# Patient Record
Sex: Male | Born: 1970
Health system: Southern US, Community
[De-identification: ages and names within clinical notes are randomized; demographics above are authoritative.]

## PROBLEM LIST (undated history)

## (undated) DIAGNOSIS — I10 Essential (primary) hypertension: Secondary | ICD-10-CM

## (undated) DIAGNOSIS — R12 Heartburn: Secondary | ICD-10-CM

## (undated) DIAGNOSIS — R319 Hematuria, unspecified: Secondary | ICD-10-CM

## (undated) DIAGNOSIS — F1011 Alcohol abuse, in remission: Secondary | ICD-10-CM

## (undated) DIAGNOSIS — F429 Obsessive-compulsive disorder, unspecified: Secondary | ICD-10-CM

## (undated) DIAGNOSIS — F419 Anxiety disorder, unspecified: Secondary | ICD-10-CM

## (undated) DIAGNOSIS — F319 Bipolar disorder, unspecified: Secondary | ICD-10-CM

## (undated) DIAGNOSIS — F952 Tourette's disorder: Secondary | ICD-10-CM

## (undated) DIAGNOSIS — K7460 Unspecified cirrhosis of liver: Secondary | ICD-10-CM

## (undated) DIAGNOSIS — E119 Type 2 diabetes mellitus without complications: Secondary | ICD-10-CM

## (undated) DIAGNOSIS — R3989 Other symptoms and signs involving the genitourinary system: Secondary | ICD-10-CM

## (undated) DIAGNOSIS — E785 Hyperlipidemia, unspecified: Secondary | ICD-10-CM

## (undated) HISTORY — DX: Obsessive-compulsive disorder, unspecified: F42.9

## (undated) HISTORY — DX: Other symptoms and signs involving the genitourinary system: R39.89

## (undated) HISTORY — DX: Hyperlipidemia, unspecified: E78.5

## (undated) HISTORY — DX: Anxiety disorder, unspecified: F41.9

## (undated) HISTORY — DX: Unspecified cirrhosis of liver: K74.60

## (undated) HISTORY — DX: Tourette's disorder: F95.2

## (undated) HISTORY — DX: Alcohol abuse, in remission: F10.11

## (undated) HISTORY — DX: Hematuria, unspecified: R31.9

## (undated) HISTORY — DX: Bipolar disorder, unspecified: F31.9

---

## 2008-08-19 ENCOUNTER — Emergency Department (HOSPITAL_COMMUNITY): Admission: EM | Admit: 2008-08-19 | Discharge: 2008-08-19 | Payer: Self-pay | Admitting: Emergency Medicine

## 2009-05-05 HISTORY — PX: MULTIPLE TOOTH EXTRACTIONS: SHX2053

## 2009-12-24 ENCOUNTER — Ambulatory Visit: Payer: Self-pay | Admitting: Internal Medicine

## 2009-12-24 DIAGNOSIS — Z9189 Other specified personal risk factors, not elsewhere classified: Secondary | ICD-10-CM | POA: Insufficient documentation

## 2009-12-24 DIAGNOSIS — T887XXA Unspecified adverse effect of drug or medicament, initial encounter: Secondary | ICD-10-CM | POA: Insufficient documentation

## 2009-12-24 DIAGNOSIS — E785 Hyperlipidemia, unspecified: Secondary | ICD-10-CM | POA: Insufficient documentation

## 2009-12-24 DIAGNOSIS — F952 Tourette's disorder: Secondary | ICD-10-CM | POA: Insufficient documentation

## 2009-12-24 DIAGNOSIS — K219 Gastro-esophageal reflux disease without esophagitis: Secondary | ICD-10-CM | POA: Insufficient documentation

## 2009-12-24 LAB — CONVERTED CEMR LAB
Cholesterol, target level: 200 mg/dL
LDL Goal: 160 mg/dL

## 2010-02-15 ENCOUNTER — Encounter: Payer: Self-pay | Admitting: Internal Medicine

## 2010-06-04 NOTE — Assessment & Plan Note (Signed)
Summary: NEW TO EST/KN   Vital Signs:  Patient profile:   40 year old male Height:      71 inches Weight:      191.4 pounds BMI:     26.79 Temp:     98.7 degrees F oral Pulse rate:   84 / minute Resp:     14 per minute BP sitting:   136 / 80  (left arm) Cuff size:   large  Vitals Entered By: Shonna Chock CMA (December 24, 2009 1:57 PM) CC: New Patient Establish: Referral to specialist, Lipid Management   CC:  New Patient Establish: Referral to specialist and Lipid Management.  History of Present Illness: Miguel Guerra is here to establish ; he has Tourette's Syndrome & he feels the medication, Orap ( Pimozide) is  not controlling his condition adequately & requests referral. He has been  on 1 mg once daily for 8 -10 years ; Epocrates states 1-2  mg is starting dose & maximal dose = 10 mg / day. Complete labs & EKG done in 05/2009 @ [facility].  Lipid Management History:      Positive NCEP/ATP III risk factors include current tobacco user.  Negative NCEP/ATP III risk factors include male age less than 98 years old, non-diabetic, no family history for ischemic heart disease, non-hypertensive, no ASHD (atherosclerotic heart disease), no prior stroke/TIA, no peripheral vascular disease, and no history of aortic aneurysm.     Preventive Screening-Counseling & Management  Alcohol-Tobacco     Smoking Status: current  Caffeine-Diet-Exercise     Does Patient Exercise: yes  Allergies (verified): 1)  ! Pcn  Past History:  Past Medical History: Tourette's Syndrome Hyperlipidemia: Framingham's Study LDL goal = < 160. GERD  Past Surgical History: TEETH EXTRACTION, PMH  OF (ICD-V15.9)    Family History: Father: Living, Alcohol excess Mother: Living, PMH  of Beast Cancer Siblings: 3 sisters: negative; MGF: MI after 43; P uncle: DM  Social History: Occupation: Administrator, [employer] Current Smoker: 1 ppd Alcohol use-yes: 6-8 beers / day Regular exercise-yes: walking 1 mpd >  3X/week Smoking Status:  current Does Patient Exercise:  yes  Review of Systems  The patient denies anorexia, fever, vision loss, decreased hearing, hoarseness, chest pain, syncope, dyspnea on exertion, peripheral edema, abdominal pain, melena, hematochezia, severe indigestion/heartburn, hematuria, suspicious skin lesions, abnormal bleeding, enlarged lymph nodes, and angioedema.         Weight gain of 50 # over 12 months, ? from beer intake. No dysphagia. Resp:  Complains of cough; denies coughing up blood, shortness of breath, sputum productive, and wheezing. Neuro:  Denies brief paralysis, difficulty with concentration, disturbances in coordination, headaches, memory loss, numbness, poor balance, sensation of room spinning, and tremors; Visual loss  for 15-20 minutes with painless migraines w/o prodrome. Occasional tingling in hands. Psych:  Denies anxiety, depression, easily angered, easily tearful, and irritability; PMH of depression.  Physical Exam  General:  well-nourished; alert,appropriate and cooperative throughout examination Head:  Normocephalic and atraumatic without obvious abnormalities. Pattern  alopecia  Eyes:  No corneal or conjunctival inflammation noted. EOMI. Perrla. Funduscopic exam benign, without hemorrhages, exudates or papilledema.  Ears:  External ear exam shows no significant lesions or deformities.  Otoscopic examination reveals clear canals, tympanic membranes are intact bilaterally without bulging, retraction, inflammation or discharge. Hearing is grossly normal bilaterally. Nose:  External nasal examination shows no deformity or inflammation. Nasal mucosa are pink and moist without lesions or exudates. Mouth:  Oral mucosa and oropharynx  without lesions or exudates.  Teeth in good repair but tooth loss. Neck:  No deformities, masses, or tenderness noted. Lungs:  Normal respiratory effort, chest expands symmetrically. Lungs are clear to auscultation, no crackles or  wheezes. Heart:  Normal rate and regular rhythm. S1 and S2 normal without gallop, murmur, click, rub .S4 Abdomen:  Bowel sounds positive,abdomen soft and non-tender without masses, organomegaly or hernias noted. Msk:  No deformity or scoliosis noted of thoracic or lumbar spine.   Pulses:  R and L carotid,radial,dorsalis pedis and posterior tibial pulses are full and equal bilaterally Extremities:  No clubbing, cyanosis, edema, or deformity noted with normal full range of motion of all joints.   Neurologic:  alert & oriented X3, cranial nerves II-XII intact, strength normal in all extremities, sensation intact to light touch, gait normal, DTRs symmetrical and normal, finger-to-nose normal, and Romberg negative.   Skin:  Intact without suspicious lesions or rashes Cervical Nodes:  No lymphadenopathy noted Axillary Nodes:  No palpable lymphadenopathy Psych:  memory intact for recent and remote, normally interactive, and good eye contact.     Impression & Recommendations:  Problem # 1:  TOURETTE'S DISORDER (ICD-307.23)  Suboptimal control  Orders: TLB-CBC Platelet - w/Differential (85025-CBCD) TLB-Hepatic/Liver Function Pnl (80076-HEPATIC) TLB-Creatinine, Blood (82565-CREA) TLB-Potassium (K+) (84132-K) TLB-BUN (Urea Nitrogen) (84520-BUN) Neurology Referral (Neuro)  Problem # 2:  HYPERLIPIDEMIA (ICD-272.4) Labs done 05/2009  Problem # 3:  GERD (ICD-530.81)  His updated medication list for this problem includes:    Ranitidine Hcl 150 Mg Tabs (Ranitidine hcl) .Marland Kitchen... 1 two times a day pre meals  Complete Medication List: 1)  Crestor-? Dose  .Marland KitchenMarland Kitchen. 1 by mouth once daily 2)  Orap 1 Mg Tabs (Pimozide) .Marland Kitchen.. 1 by mouth  two times a day pending neurology appt 3)  Otc Anti-acid  .Marland Kitchen.. 1 by mouth once daily 4)  Ranitidine Hcl 150 Mg Tabs (Ranitidine hcl) .Marland Kitchen.. 1 two times a day pre meals  Lipid Assessment/Plan:      Based on NCEP/ATP III, the patient's risk factor category is "0-1 risk factors".   The patient's lipid goals are as follows: Total cholesterol goal is 200; LDL cholesterol goal is 160; HDL cholesterol goal is 40; Triglyceride goal is 150.    Patient Instructions: 1)  Avoid foods high in acid (tomatoes, citrus juices, spicy foods). Avoid eating within two hours of lying down or before exercising. Do not over eat; try smaller more frequent meals. Elevate head of bed twelve inches when sleeping.Consider the Smoking Cessation program @ [facility] [phone]). Please FAX ( [phone])  Lab results from 05/2009. Prescriptions: RANITIDINE HCL 150 MG TABS (RANITIDINE HCL) 1 two times a day pre meals  #60 x 5   Entered and Authorized by:   Marga Melnick MD   Signed by:   Marga Melnick MD on 12/24/2009   Method used:   Print then Give to Patient   RxID:   [id number]

## 2010-06-04 NOTE — Consult Note (Signed)
Summary: [facility]  [facility]   Imported By: Lanelle Bal 02/23/2010 08:26:27  _____________________________________________________________________  External Attachment:    Type:   Image     Comment:   External Document

## 2010-08-14 LAB — URINALYSIS, ROUTINE W REFLEX MICROSCOPIC
Specific Gravity, Urine: 1.034 — ABNORMAL HIGH (ref 1.005–1.030)
Urobilinogen, UA: 2 mg/dL — ABNORMAL HIGH (ref 0.0–1.0)

## 2010-08-14 LAB — URINE MICROSCOPIC-ADD ON

## 2011-01-06 IMAGING — CT CT NECK W/ CM
3 of 4 series · 16 of 33 positions shown, 19 images · IV contrast (APPLIED)
Comparison: None

CLINICAL DATA: Tooth pain.  Evaluate for lingual abscess

CT NECK WITH CONTRAST
TECHNIQUE: Multidetector CT imaging of the neck was performed with
intravenous contrast.
Contrast: 100 ml Dmnipaque-JCC

[Series 5: neck_routine 3.0 b40s st · axial · 0.36mm/px · z∈[+995,+1160]mm · 8 of 69 slices shown, 10 images]
[im 7/69  soft-tissue]
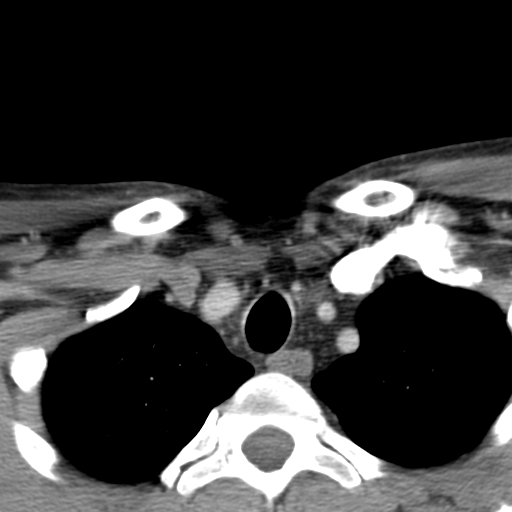
[im 7/69  bone]
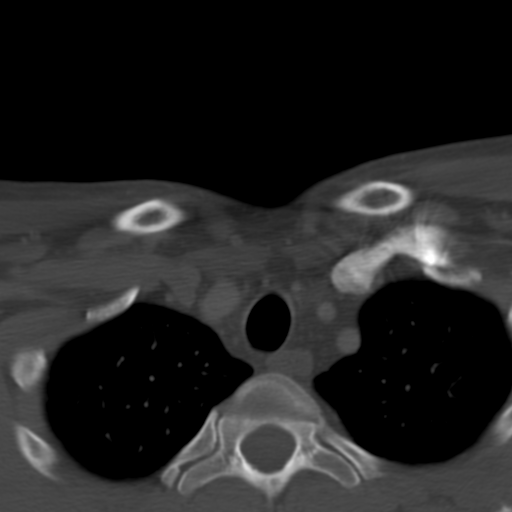
[im 14/69  bone]
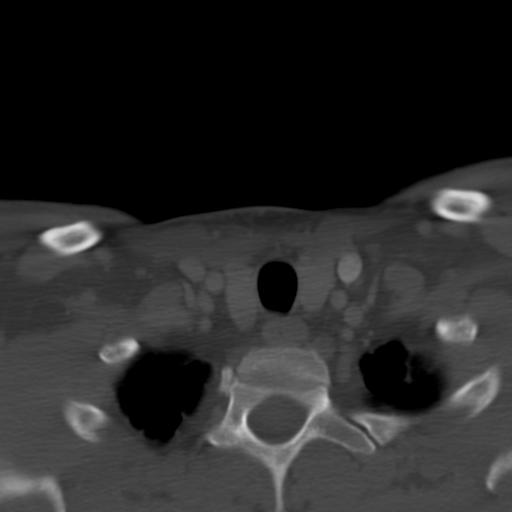
[im 21/69  bone]
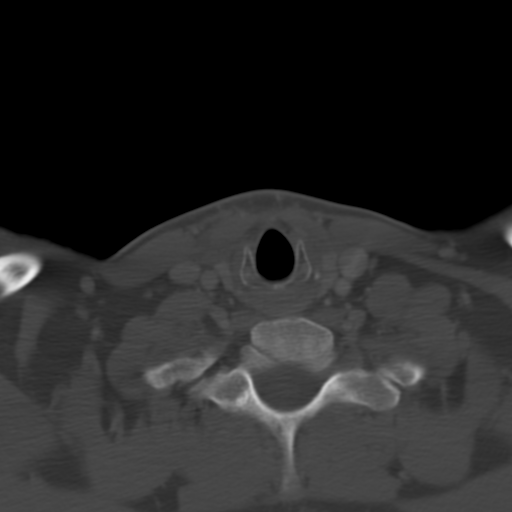
[im 28/69  bone]
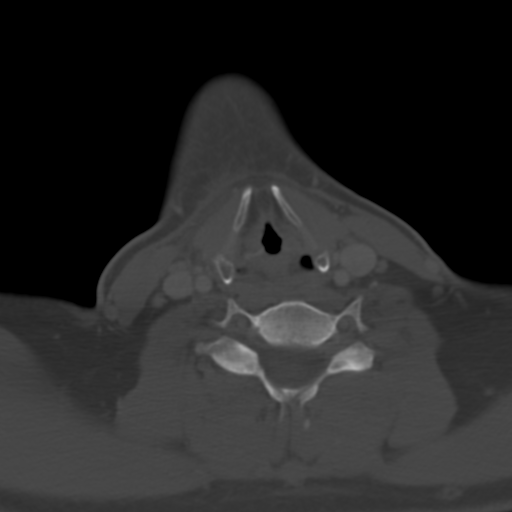
[im 41/69  soft-tissue]
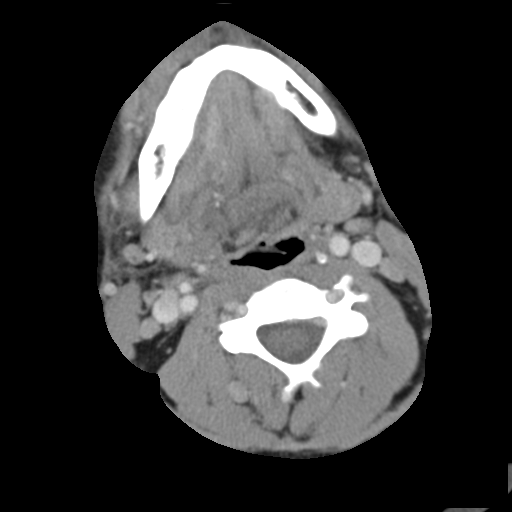
[im 41/69  bone]
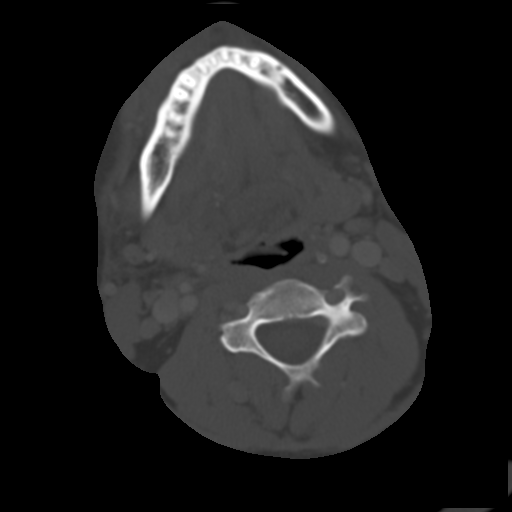
[im 48/69  bone]
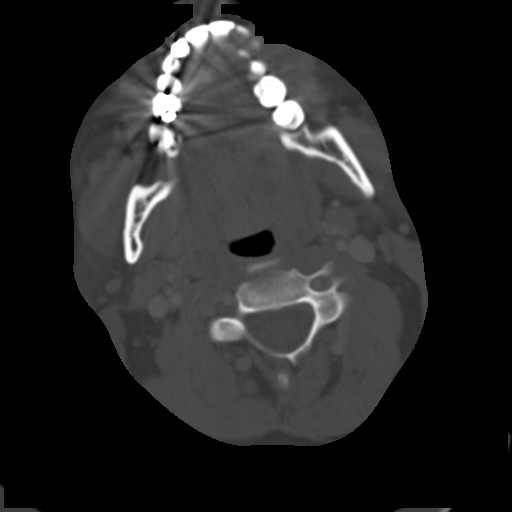
[im 55/69  bone]
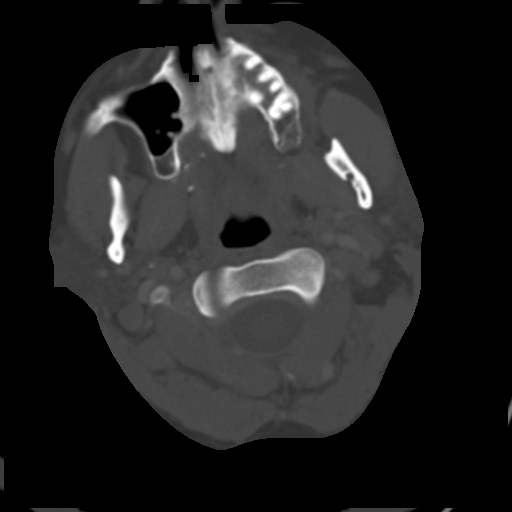
[im 62/69  bone]
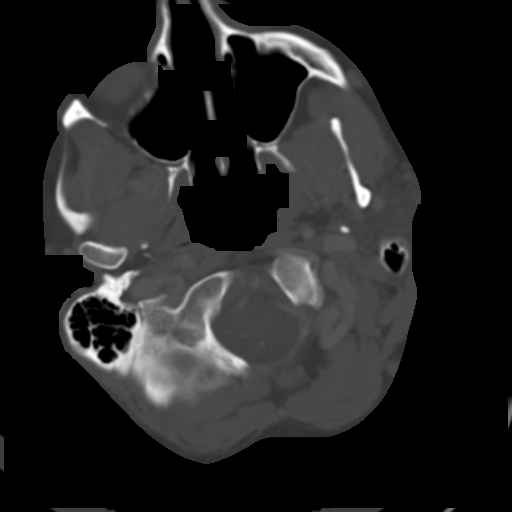

[Series 602: coronal · coronal · 0.41mm/px · 3 of 78 slices shown]
[im 16/78  bone]
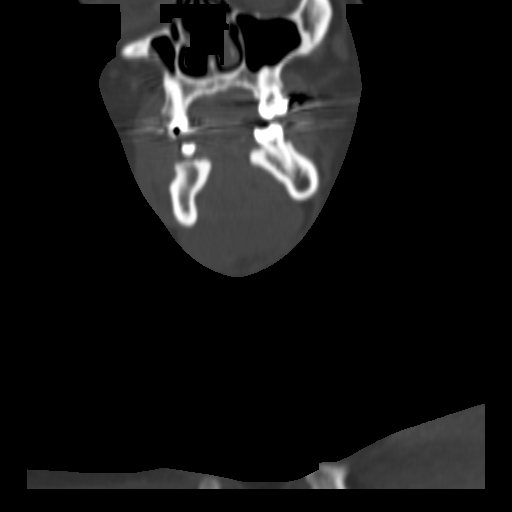
[im 31/78  bone]
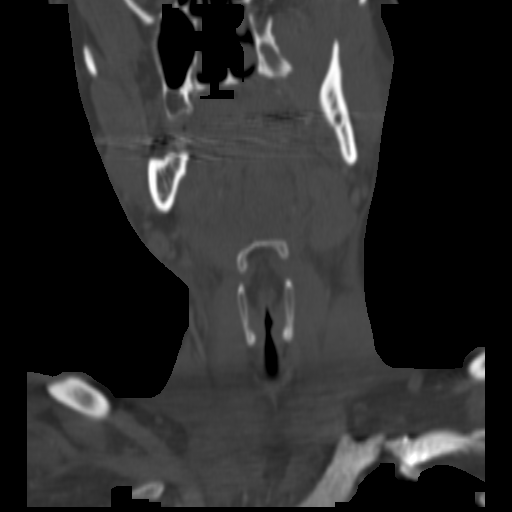
[im 47/78  bone]
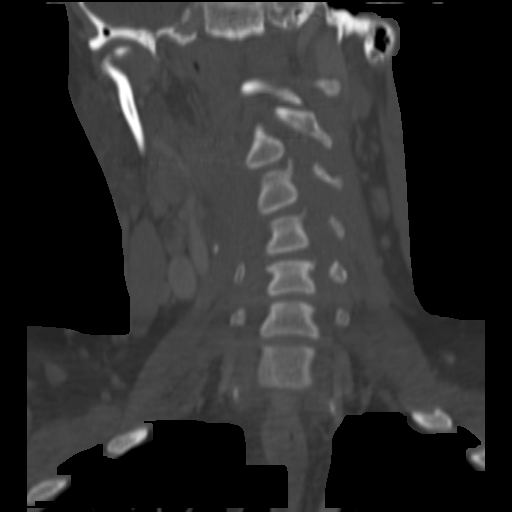

[Series 603: sagittal · sagittal · 0.41mm/px · 5 of 47 slices shown, 6 images]
[im 16/47  bone]
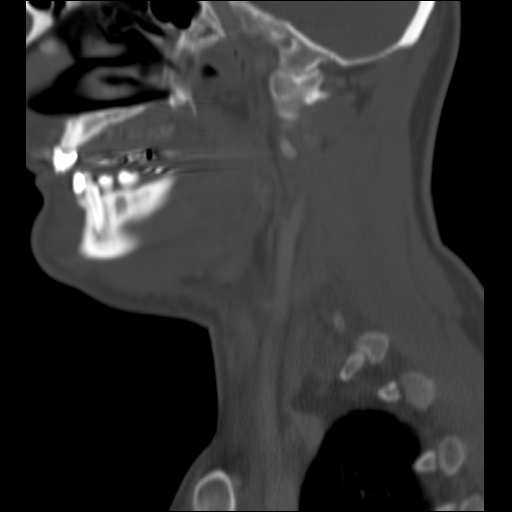
[im 20/47  bone]
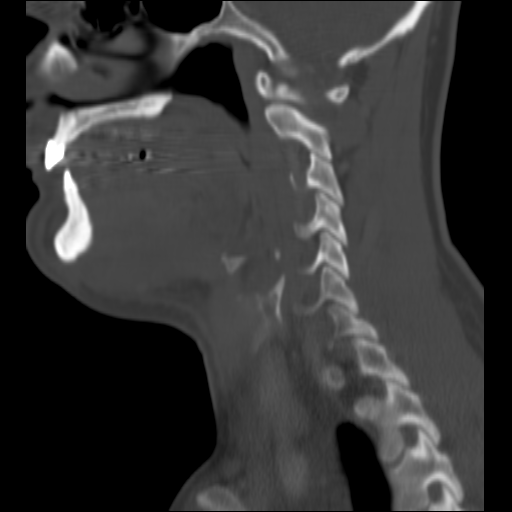
[im 24/47  soft-tissue]
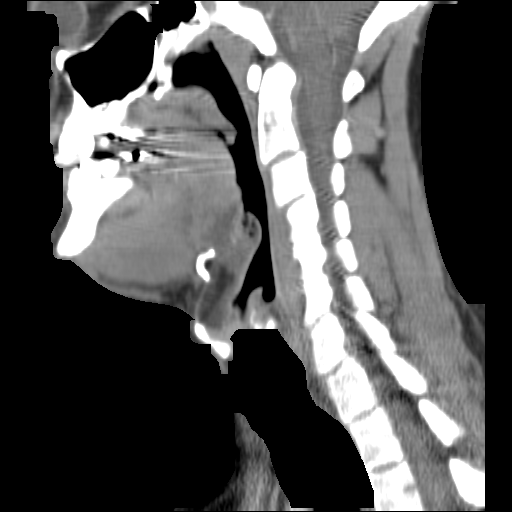
[im 24/47  bone]
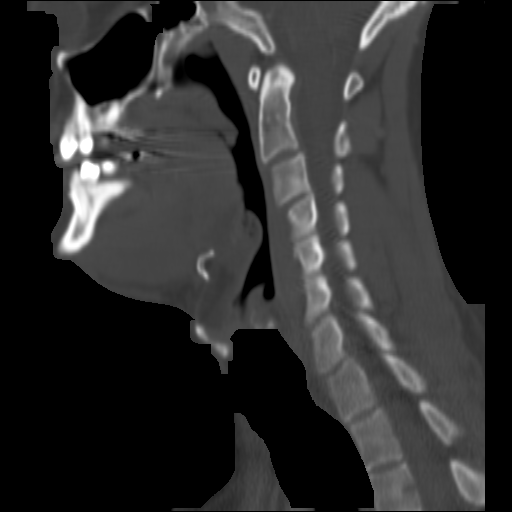
[im 27/47  bone]
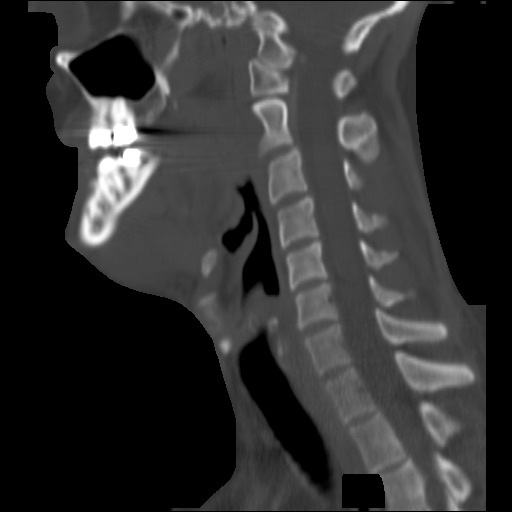
[im 31/47  bone]
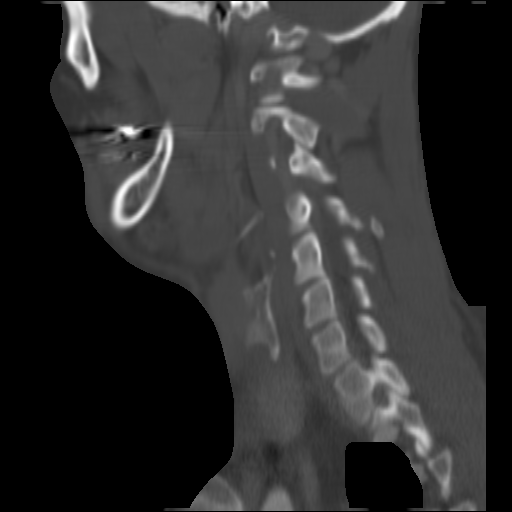

[16 of 33 positions shown; findings below may reference images not displayed]

FINDINGS: There is thickening involving the musculature in the
floor of the mouth, and this is best seen on the sagittal views.
There is a small 8 x 17 mm fluid collection within the anterior
aspect of the inflammatory process on image 36. Small inflammatory
nodes are present in the cervical chain.  There is peri apical
lucency surrounding the right lateral lower incisor.  This is
worrisome for periodontal abscess.  No evidence of peritonsillar
abscess.  The tongue base is within normal limits.  Nasopharynx and
oropharynx are patent.  Thyroid gland is within normal limits.
IMPRESSION: Inflammatory process involving the musculature of the floor of the
mouth.  In the anterior aspect, there is a small fluid collection
worrisome for a small abscess.  This is associated with periodontal
abscess involving the right lateral lower incisor.

## 2012-12-16 ENCOUNTER — Emergency Department (INDEPENDENT_AMBULATORY_CARE_PROVIDER_SITE_OTHER)
Admission: EM | Admit: 2012-12-16 | Discharge: 2012-12-16 | Disposition: A | Discharge status: Home / Self Care | Payer: Self-pay | Source: Home / Self Care

## 2012-12-16 ENCOUNTER — Encounter (HOSPITAL_COMMUNITY): Payer: Self-pay | Admitting: *Deleted

## 2012-12-16 DIAGNOSIS — L03119 Cellulitis of unspecified part of limb: Secondary | ICD-10-CM

## 2012-12-16 DIAGNOSIS — L02419 Cutaneous abscess of limb, unspecified: Secondary | ICD-10-CM

## 2012-12-16 HISTORY — DX: Type 2 diabetes mellitus without complications: E11.9

## 2012-12-16 HISTORY — DX: Heartburn: R12

## 2012-12-16 HISTORY — DX: Essential (primary) hypertension: I10

## 2012-12-16 MED ORDER — DOXYCYCLINE HYCLATE 100 MG PO CAPS: 100.0000 mg | ORAL_CAPSULE | Freq: Two times a day (BID) | ORAL | Status: DC

## 2012-12-16 NOTE — ED Provider Notes (Signed)
Medical screening examination/treatment/procedure(s) were performed by non-physician practitioner and as supervising physician I was immediately available for consultation/collaboration.  Leslee Home, M.D.  Reuben Likes, MD 12/16/12 1434

## 2012-12-16 NOTE — ED Notes (Signed)
Boil on R post. Thigh and R groin onset 3-4 days ago.  Have not drained.  Very sensitive.  Had one in Jan. But states [facility] did not do a culture  but did take Doxycycline.

## 2012-12-16 NOTE — ED Provider Notes (Signed)
CSN: [id number]     Arrival date & time 12/16/12  0902 History     First MD Initiated Contact with Patient 12/16/12 1030     Chief Complaint  Patient presents with  . Abscess   (Consider location/radiation/quality/duration/timing/severity/associated sxs/prior Treatment) HPI Comments: 42 year old male with a history of type 2 diabetes mellitus and skin infections with abscess formation most likely due to staphylococcus or MRSA. Today he presents with 2 lesions, one located to the right anterior proximal thigh and the second located in the right proximal posterior medial thigh. Each of these approximately the same size with a 2 and half to 3 cm diameter. There is underlying induration but is not appear or palpates as fluctuant.   Past Medical History  Diagnosis Date  . Diabetes mellitus without complication     off medication x 1 month  . Heartburn   . Hypertension    Past Surgical History  Procedure Laterality Date  . Multiple tooth extractions  2011   Family History  Problem Relation Age of Onset  . Breast cancer Mother    History  Substance Use Topics  . Smoking status: Current Some Day Smoker    Types: Cigars  . Smokeless tobacco: Not on file  . Alcohol Use: 43.2 oz/week    70 Cans of beer, 2 Shots of liquor per week    Review of Systems  Constitutional: Negative.   Respiratory: Negative.   Skin: Negative for rash.       Per history of present illness    Allergies  Penicillins  Home Medications   Current Outpatient Rx  Name  Route  Sig  Dispense  Refill  . Omeprazole-Sodium Bicarbonate (ZEGERID OTC PO)   Oral   Take by mouth.         . pimozide (ORAP) 2 MG tablet   Oral   Take 2 mg by mouth once.         . terbinafine (LAMISIL) 250 MG tablet   Oral   Take 250 mg by mouth daily.         Marland Kitchen doxycycline (VIBRAMYCIN) 100 MG capsule   Oral   Take 1 capsule (100 mg total) by mouth 2 (two) times daily.   20 capsule   0    BP 145/88  Pulse 83   Temp(Src) 98.2 F (36.8 C) (Oral)  Resp 18  SpO2 100% Physical Exam  Nursing note and vitals reviewed. Constitutional: He is oriented to person, place, and time. He appears well-developed and well-nourished. No distress.  Neck: Normal range of motion. Neck supple.  Pulmonary/Chest: Effort normal. No respiratory distress.  Neurological: He is alert and oriented to person, place, and time. He exhibits normal muscle tone.  Skin: Skin is warm and dry. No rash noted. He is not diaphoretic.  See history of present illness for description of the 2 right upper extremity lesions. There is no associated lymphangitis or radiating erythema. No evidence of cellulitis.  Psychiatric: He has a normal mood and affect.    ED Course   Procedures (including critical care time)  Labs Reviewed - No data to display No results found. 1. Bacterial skin infection of leg     MDM  These are early abscess  which do not appear fluctuant at this time. We will treat with doxycycline 100 mg twice a day for 10 days, same treatment as he had in January with similar lesions and a good outcome. Very important to apply warm compresses several times  during the day. If there is any worsening, redness, lymphangitis, increasing in size and becoming more painful when need to return.  Hayden Rasmussen, NP 12/16/12 1050

## 2013-06-17 ENCOUNTER — Encounter: Payer: Self-pay | Admitting: Neurology

## 2013-06-20 ENCOUNTER — Telehealth: Payer: Self-pay | Admitting: Neurology

## 2013-06-21 ENCOUNTER — Ambulatory Visit: Payer: BC Managed Care – PPO | Admitting: Neurology

## 2013-10-17 ENCOUNTER — Ambulatory Visit (INDEPENDENT_AMBULATORY_CARE_PROVIDER_SITE_OTHER): Payer: BC Managed Care – PPO | Admitting: Neurology

## 2013-10-17 ENCOUNTER — Encounter: Payer: Self-pay | Admitting: Neurology

## 2013-10-17 VITALS — BP 116/74 | HR 65 | Ht 70.5 in | Wt 180.0 lb

## 2013-10-17 DIAGNOSIS — F952 Tourette's disorder: Secondary | ICD-10-CM

## 2013-10-17 MED ORDER — PIMOZIDE 2 MG PO TABS: 2.0000 mg | ORAL_TABLET | Freq: Two times a day (BID) | ORAL | Status: DC

## 2013-10-17 NOTE — Progress Notes (Signed)
Reason for visit: Tourette's syndrome  Miguel Guerra is a 43 y.o. male  History of present illness:  Miguel Guerra is a 43 year old right-handed white male with a history of Tourette syndrome that has also been associated with obsessive compulsive tendencies and anxiety and depression. The patient lost his job approximately 2 years ago associated with alcoholism. Since that time, he has been able to get off of alcohol, but as time has gone, his Tourette's type symptoms have worsened. The patient has been on Orap for 5 or 6 years with good benefit. The patient has motor tics involving the head with head shaking intermittently. This has started to return some. The patient takes Xanax for anxiety issues, and he is on Depakote. The patient has diabetes, but he has had good control of the blood sugars with weight loss. He indicates that his mother also has obsessive compulsive tendencies. He has not been able to maintain gainful employment over the last 2 years. He returns to this office for an evaluation. The patient is on Abilify secondary to some problems with hallucinations that occurred in the past.  Past Medical History  Diagnosis Date  . Diabetes mellitus without complication     off medication x 1 month  . Heartburn   . Hypertension   . Hematuria   . Other symptoms involving urinary system(788.99)   . History of alcohol abuse   . Tourette disorder   . Anxiety   . OCD (obsessive compulsive disorder)     Past Surgical History  Procedure Laterality Date  . Multiple tooth extractions  2011    Family History  Problem Relation Age of Onset  . Breast cancer Mother   . OCD Mother   . Skin cancer Father     Social history:  reports that he has been smoking Cigars.  He has never used smokeless tobacco. He reports that he does not drink alcohol or use illicit drugs.  Medications:  Current Outpatient Prescriptions on File Prior to Visit  Medication Sig Dispense Refill  . ALPRAZolam  (XANAX) 0.5 MG tablet Take 0.5 mg by mouth 3 (three) times daily.      Marland Kitchen atorvastatin (LIPITOR) 40 MG tablet Take 40 mg by mouth daily.      Marland Kitchen lisinopril (PRINIVIL,ZESTRIL) 10 MG tablet Take 10 mg by mouth daily.      . metFORMIN (GLUCOPHAGE) 1000 MG tablet Take 1,000 mg by mouth 2 (two) times daily with a meal.      . Omeprazole-Sodium Bicarbonate (ZEGERID OTC PO) Take by mouth.       No current facility-administered medications on file prior to visit.      Allergies  Allergen Reactions  . Penicillins     REACTION: Swelling of Neck    ROS:  Out of a complete 14 system review of symptoms, the patient complains only of the following symptoms, and all other reviewed systems are negative.  Diarrhea Depression, anxiety, decreased energy, disinterest in activities, hallucinations, racing thoughts  Blood pressure 116/74, pulse 65, height 5' 10.5" (1.791 m), weight 180 lb (81.647 kg).  Physical Exam  General: The patient is alert and cooperative at the time of the examination.  Eyes: Pupils are equal, round, and reactive to light. Discs are flat bilaterally.  Neck: The neck is supple, no carotid bruits are noted.  Respiratory: The respiratory examination is clear.  Cardiovascular: The cardiovascular examination reveals a regular rate and rhythm, no obvious murmurs or rubs are noted.  Skin:  Extremities are without significant edema.  Neurologic Exam  Mental status: The patient is alert and oriented x 3 at the time of the examination. The patient has apparent normal recent and remote memory, with an apparently normal attention span and concentration ability.  Cranial nerves: Facial symmetry is present. There is good sensation of the face to pinprick and soft touch bilaterally. The strength of the facial muscles and the muscles to head turning and shoulder shrug are normal bilaterally. Speech is well enunciated, no aphasia or dysarthria is noted. Extraocular movements are full.  Visual fields are full. The tongue is midline, and the patient has symmetric elevation of the soft palate. No obvious hearing deficits are noted.  Motor: The motor testing reveals 5 over 5 strength of all 4 extremities. Good symmetric motor tone is noted throughout.  Sensory: Sensory testing is intact to pinprick, soft touch, vibration sensation, and position sense on all 4 extremities. No evidence of extinction is noted.  Coordination: Cerebellar testing reveals good finger-nose-finger and heel-to-shin bilaterally.  Gait and station: Gait is normal. Tandem gait is normal. Romberg is negative. No drift is seen.  Reflexes: Deep tendon reflexes are symmetric and normal bilaterally. Toes are downgoing bilaterally.   Assessment/Plan:  1. Tourette's syndrome  2. Anxiety, depression  3. History of alcohol abuse  The patient will be increased on the Orap taking 2 mg twice daily. The patient will followup through this office in about 6 months. If he has side effects on the medication, he is to contact me. No issues with motor tics were observed during this examination.  Jill Alexanders MD 10/17/2013 7:49 PM  [facility] [address] [place], [place] [address]  Phone [phone] Fax [phone]

## 2013-10-17 NOTE — Patient Instructions (Signed)
Tourette Syndrome Tourette syndrome (TS) is an inherited neurological disorder. It shows up in childhood usually before age 45 years. Symptoms reveal themselves as repeated involuntary movements and uncontrollable vocal sounds. Both the sounds and the movements are called tics. Less commonly the tics may include inappropriate words and phrases. Usually this syndrome is mild and often may not even be diagnosed or suspected. Uncommonly the problem is severe.  CAUSES  The exact cause of TS is unknown. Genetic and environmental factors may be involved. The majority of cases of TS are inherited. No gene has been identified which causes TS. In some cases, tics may not be inherited. These cases are identified as sporadic TS. SYMPTOMS  The first symptoms of TS are usually facial tics and eye blinking. Tics may begin in one part of the body and spread. Some patients will have one tic, while other patients will have several tics. Tics may also increase in number over time. Children will often be distracted by the tics and may appear to have trouble concentrating. Other motor tics may appear, such as:  Head jerking.  Neck stretching.  Jumping  Foot stamping.  Body twisting and bending. Vocal tics are also common in a person with TS and include:  Shout.  Bark.  Yelp.  Grunt.  Sniff.  Cough.  Clear his or her throat. A person with TS may touch other people excessively or repeat actions over and over. Some TS patients also express compulsive behaviors like checking, counting or repeating certain words. A few patients with TS have self-harming behaviors such as lip and cheek biting and head banging. People with TS can sometimes suppress their tics for a short time, but eventually tension mounts to the point where the tic must escape. Tics worsen in stressful situations and improve when the person relaxes or is absorbed in an activity.  Additionally, there is an association with attention deficit  hyperactivity disorder (50% patients with TS also have ADHD), learning disabilities (30%), and obsessive compulsive disorder (25%- 40%). DIAGNOSIS  Tourette syndrome is diagnosed by observing the symptoms and evaluating family history. In order for a child to be diagnosed with TS, they must have both a motor and verbal tic for at least 12 months. TS is a diagnosis made from a study of the signs and symptoms of a disease (clinical diagnosis). There are no medical or screening tests that can help in making the diagnosis, but your physician may order other tests (EEG, MRI, CAT scan, or blood tests) to make sure your child's symptoms are not due to another condition. TREATMENT   Medication management is available for TS, and its associated conditions. Not all tics need medication and no medication will completely make tics go away. Treatment decisions need to consider both the potential side effects of medication and the quality of life of the patient.  Medication is also available to help when symptoms are troubling or interfere with life. TS medications are only able to help reduce specific symptoms. Relaxation techniques and biofeedback may be useful in handling and lessening stress. Behavioral therapies include:  Skill building- focuses on deficiencies in academic and social skills.  Behavioral excesses- focuses on the elimination of unwanted behaviors.  Educational accommodations can include un-timed tests or the use of scribes for those with handwriting problems. There is no cure for Tourette's and no medication that works for all people. Knowledge, education and understanding are uppermost in management plans for tic disorders. Supportive counseling and therapy may help:  Avoid depression.  Limit social isolation.  Improve family support. Educating the patient, family, and patient's community (friends, school, and church) are key treatment strategies. This may be all that is required in mild  cases. There is no cure for TS. The condition in many individuals improves as they mature. Although TS is generally lifelong and chronic, it generally does not get worse as children get older. In a few cases, complete remission occurs after adolescence. Document Released: 04/21/2005 Document Revised: 07/14/2011 Document Reviewed: 02/19/2009 [facility] Patient Information 2014 [place].

## 2013-11-01 NOTE — Telephone Encounter (Signed)
Closing encounter

## 2014-01-06 ENCOUNTER — Other Ambulatory Visit: Payer: Self-pay | Admitting: Family Medicine

## 2014-01-06 ENCOUNTER — Ambulatory Visit
Admission: RE | Admit: 2014-01-06 | Discharge: 2014-01-06 | Disposition: A | Discharge status: Home / Self Care | Payer: BC Managed Care – PPO | Source: Ambulatory Visit | Attending: Family Medicine | Admitting: Family Medicine

## 2014-01-06 DIAGNOSIS — R062 Wheezing: Secondary | ICD-10-CM

## 2014-01-11 ENCOUNTER — Emergency Department (HOSPITAL_COMMUNITY)
Admission: EM | Admit: 2014-01-11 | Discharge: 2014-01-11 | Discharge status: Against Medical Advice | Payer: BC Managed Care – PPO | Source: Home / Self Care | Attending: Family Medicine | Admitting: Family Medicine

## 2014-01-11 ENCOUNTER — Encounter (HOSPITAL_COMMUNITY): Payer: Self-pay | Admitting: Emergency Medicine

## 2014-01-11 NOTE — ED Notes (Signed)
C/o rash to right lower extremity onset 1 week Believes it maybe due to a medication reaction  Taking PCN for a URI??? Rash is getting better Alert, no signs of acute distress.

## 2014-01-11 NOTE — ED Notes (Signed)
Pt not found; Left AMA... Notified. Dr. Marily Memos.

## 2014-01-11 NOTE — ED Notes (Signed)
Provider went into room and pt was nowhere to be found; will check in a few minutes again

## 2014-02-21 ENCOUNTER — Telehealth: Payer: Self-pay | Admitting: Neurology

## 2014-02-21 NOTE — Telephone Encounter (Signed)
Spoke to patient and he will come tomorrow 02-22-14 for appointment with doctor.  He relayed he will speak to him at that time.

## 2014-02-21 NOTE — Telephone Encounter (Signed)
Patient calling to request sooner appointment with Dr. Jannifer Franklin per his PCP's advice, states that he's been very dizzy and off balance as well as forgetful, please return call and advise.

## 2014-02-22 ENCOUNTER — Encounter: Payer: Self-pay | Admitting: Neurology

## 2014-02-22 ENCOUNTER — Ambulatory Visit (INDEPENDENT_AMBULATORY_CARE_PROVIDER_SITE_OTHER): Payer: BC Managed Care – PPO | Admitting: Neurology

## 2014-02-22 ENCOUNTER — Encounter (INDEPENDENT_AMBULATORY_CARE_PROVIDER_SITE_OTHER): Payer: Self-pay

## 2014-02-22 VITALS — BP 125/86 | HR 98 | Ht 71.0 in | Wt 197.8 lb

## 2014-02-22 DIAGNOSIS — Z5181 Encounter for therapeutic drug level monitoring: Secondary | ICD-10-CM

## 2014-02-22 DIAGNOSIS — F952 Tourette's disorder: Secondary | ICD-10-CM

## 2014-02-22 NOTE — Progress Notes (Signed)
Reason for visit: Tourette's syndrome  Miguel Guerra is an 43 y.o. male  History of present illness:  Miguel Guerra is a 43 year old right-handed white male with a history of Tourette syndrome. The patient is also followed by Dr. Candis Schatz from psychiatry. The patient began having hallucinations sometime within the last 6 months. The patient has been on pimozide 2 mg twice daily. The patient had been doing fairly well with his Tourette's syndrome on this medication. The patient has had several new medications added within the last several months. Three or four months ago, the patient was placed on Depakote, currently on 500 mg 3 times daily. The patient was also apparently placed on lamotrigine within the last 2 months, currently on 200 mg daily, and Risperdal was also added, currently on 4 mg daily. The patient has begun having issues with dizziness, decreased ability to concentrate. The patient comes to the office today for an evaluation of his current issues. The patient was able to drive to our office today, but he does not feel certain about his abilities to drive.  Past Medical History  Diagnosis Date  . Diabetes mellitus without complication     off medication x 1 month  . Heartburn   . Hypertension   . Hematuria   . Other symptoms involving urinary system(788.99)   . History of alcohol abuse   . Tourette disorder   . Anxiety   . OCD (obsessive compulsive disorder)     Past Surgical History  Procedure Laterality Date  . Multiple tooth extractions  2011    Family History  Problem Relation Age of Onset  . Breast cancer Mother   . OCD Mother   . Skin cancer Father     Social history:  reports that he has been smoking Cigars.  He has never used smokeless tobacco. He reports that he does not drink alcohol or use illicit drugs.    Allergies  Allergen Reactions  . Penicillins     REACTION: Swelling of Neck    Medications:  Current Outpatient Prescriptions on File Prior  to Visit  Medication Sig Dispense Refill  . ALPRAZolam (XANAX) 0.5 MG tablet Take 0.5 mg by mouth 3 (three) times daily.      Marland Kitchen atorvastatin (LIPITOR) 40 MG tablet Take 40 mg by mouth daily.      . divalproex (DEPAKOTE) 500 MG DR tablet Take 500 mg by mouth 3 (three) times daily.       Marland Kitchen lisinopril (PRINIVIL,ZESTRIL) 10 MG tablet Take 10 mg by mouth daily.      . pimozide (ORAP) 2 MG tablet Take 1 tablet (2 mg total) by mouth 2 (two) times daily.  60 tablet  5   No current facility-administered medications on file prior to visit.    ROS:  Out of a complete 14 system review of symptoms, the patient complains only of the following symptoms, and all other reviewed systems are negative.   Weight change Double vision, blurred vision Chest tightness Achy muscles, walking difficulties, neck stiffness Memory loss, dizziness, headache Depression, anxiety, hallucinations, suicidal thoughts  Blood pressure 125/86, pulse 98, height 5\' 11"  (1.803 m), weight 197 lb 12.8 oz (89.721 kg).  Physical Exam  General: The patient is alert and cooperative at the time of the examination.  Skin: No significant peripheral edema is noted.   Neurologic Exam  Mental status: The patient is oriented x 3.  Cranial nerves: Facial symmetry is present. Speech is normal, no aphasia or dysarthria  is noted. Extraocular movements are full. Visual fields are full.  Motor: The patient has good strength in all 4 extremities.  Sensory examination: Soft sensation is symmetric on the face, arms, and legs.  Coordination: The patient has good finger-nose-finger and heel-to-shin bilaterally. No significant tremor or asterixis was seen on clinical examination.  Gait and station: The patient has a normal gait. Tandem gait is normal. Romberg is negative. No drift is seen.  Reflexes: Deep tendon reflexes are symmetric.   Assessment/Plan:  1. Tourette's syndrome   2. Depression, hallucinations  The patient has  apparently been placed on several new medications within the last several months. The patient is now having side effects that are likely related to his medications with dizziness and decreased concentration. The patient has been placed on Depakote and Lamictal in rapid succession, these 2 medications have a significant interaction with one another. The patient is to cut back on the Lamictal taking 100 mg daily, and he will have blood work done today to check levels of the Depakote, Lamictal, and an ammonia level. The patient will need to contact Dr. Candis Schatz regarding his medications. He has had risperidone added to the Orap recently as well. The patient already has an appointment to return in December 2015, and he will keep this appointment. I believe that his current symptoms are related to medication side effects.  Jill Alexanders MD 02/22/2014 8:09 PM  [facility] [address] [place], [place] [address]  Phone [phone] Fax [phone]

## 2014-02-24 LAB — COMPREHENSIVE METABOLIC PANEL
A/G RATIO: 2 (ref 1.1–2.5)
ALK PHOS: 60 IU/L (ref 39–117)
ALT: 21 IU/L (ref 0–44)
AST: 16 IU/L (ref 0–40)
Albumin: 4.7 g/dL (ref 3.5–5.5)
BILIRUBIN TOTAL: 0.2 mg/dL (ref 0.0–1.2)
BUN/Creatinine Ratio: 13 (ref 9–20)
BUN: 14 mg/dL (ref 6–24)
CHLORIDE: 94 mmol/L — AB (ref 97–108)
CO2: 24 mmol/L (ref 18–29)
Calcium: 10.2 mg/dL (ref 8.7–10.2)
Creatinine, Ser: 1.12 mg/dL (ref 0.76–1.27)
GFR, EST AFRICAN AMERICAN: 92 mL/min/{1.73_m2} (ref >59)
GFR, EST NON AFRICAN AMERICAN: 79 mL/min/{1.73_m2} (ref >59)
GLUCOSE: 174 mg/dL — AB (ref 65–99)
Globulin, Total: 2.4 g/dL (ref 1.5–4.5)
POTASSIUM: 4.4 mmol/L (ref 3.5–5.2)
SODIUM: 137 mmol/L (ref 134–144)
Total Protein: 7.1 g/dL (ref 6.0–8.5)

## 2014-02-24 LAB — VALPROIC ACID LEVEL: Valproic Acid Lvl: 55 ug/mL (ref 50–100)

## 2014-02-24 LAB — LAMOTRIGINE LEVEL: LAMOTRIGINE LVL: 7.7 ug/mL (ref 2.0–20.0)

## 2014-02-24 LAB — AMMONIA: Ammonia: 80 ug/dL (ref 27–102)

## 2014-02-27 NOTE — Progress Notes (Signed)
Quick Note:  Spoke to patient and relayed blood work results unremarkable, Lamictal and Valproic acid levels OK, per Dr. Jannifer Franklin. ______

## 2014-04-18 ENCOUNTER — Encounter: Payer: Self-pay | Admitting: Neurology

## 2014-04-18 ENCOUNTER — Ambulatory Visit (INDEPENDENT_AMBULATORY_CARE_PROVIDER_SITE_OTHER): Payer: BC Managed Care – PPO | Admitting: Neurology

## 2014-04-18 VITALS — BP 108/70 | HR 79

## 2014-04-18 DIAGNOSIS — F952 Tourette's disorder: Secondary | ICD-10-CM

## 2014-04-18 NOTE — Progress Notes (Signed)
Reason for visit: Tourette syndrome  Miguel Guerra is an 43 y.o. male  History of present illness:  Miguel Guerra is a 43 year old right-handed white male with a history of Tourette syndrome. The patient was reporting some problems with dizziness and confusion associated with a medication side effect. The patient was on Depakote, but the ammonia level was normal. The patient had been placed on Lamictal and Depakote together resulting in increased side effects from both of these medications. The patient remains on some Lamictal, but he is coming down off the medication. He is now off of Depakote completely. The patient is feeling much better, but he now reports some feelings of stiffness of the extremities. The patient is on a combination of Orap and Risperdal.  Past Medical History  Diagnosis Date  . Diabetes mellitus without complication     off medication x 1 month  . Heartburn   . Hypertension   . Hematuria   . Other symptoms involving urinary system(788.99)   . History of alcohol abuse   . Tourette disorder   . Anxiety   . OCD (obsessive compulsive disorder)     Past Surgical History  Procedure Laterality Date  . Multiple tooth extractions  2011    Family History  Problem Relation Age of Onset  . Breast cancer Mother   . OCD Mother   . Skin cancer Father     Social history:  reports that he has been smoking Cigars.  He has never used smokeless tobacco. He reports that he does not drink alcohol or use illicit drugs.    Allergies  Allergen Reactions  . Penicillins     REACTION: Swelling of Neck    Medications:  Current Outpatient Prescriptions on File Prior to Visit  Medication Sig Dispense Refill  . ALPRAZolam (XANAX) 0.5 MG tablet Take 0.5 mg by mouth as needed.     Marland Kitchen atorvastatin (LIPITOR) 40 MG tablet Take 40 mg by mouth daily.    Marland Kitchen lisinopril (PRINIVIL,ZESTRIL) 10 MG tablet Take 10 mg by mouth daily.    . pimozide (ORAP) 2 MG tablet Take 1 tablet (2 mg total)  by mouth 2 (two) times daily. 60 tablet 5  . risperidone (RISPERDAL) 4 MG tablet Take 4 mg by mouth daily.     No current facility-administered medications on file prior to visit.    ROS:  Out of a complete 14 system review of symptoms, the patient complains only of the following symptoms, and all other reviewed systems are negative.  Neck stiffness Dizziness Anxiety, hallucinations  Blood pressure 108/70, pulse 79.  Physical Exam  General: The patient is alert and cooperative at the time of the examination.  Skin: No significant peripheral edema is noted.   Neurologic Exam  Mental status: The patient is oriented x 3.  Cranial nerves: Facial symmetry is present. Speech is normal, no aphasia or dysarthria is noted. Extraocular movements are full. Visual fields are full. Mild masking of the face is seen.  Motor: The patient has good strength in all 4 extremities.  Sensory examination: Soft touch sensation is symmetric on the face, arms, and legs.  Coordination: The patient has good finger-nose-finger and heel-to-shin bilaterally.  Gait and station: The patient has a normal gait. The patient has a slightly stooped posture. Tandem gait is normal. Romberg is negative. No drift is seen.  Reflexes: Deep tendon reflexes are symmetric.   Assessment/Plan:  1. Tourette's syndrome  The patient is doing better coming off the  Depakote. He is now complains some stiffness, he may be developing a mild issue with secondary parkinsonism. The patient is being actively followed through his psychiatrist. The patient will follow-up through this office if needed.  Jill Alexanders MD 04/18/2014 6:56 PM  [facility] [address] [place], [place] [address]  Phone [phone] Fax [phone]

## 2014-04-18 NOTE — Patient Instructions (Signed)
Tourette Syndrome Tourette syndrome (TS) is an inherited neurological disorder. It shows up in childhood usually before age 45 years. Symptoms reveal themselves as repeated involuntary movements and uncontrollable vocal sounds. Both the sounds and the movements are called tics. Less commonly the tics may include inappropriate words and phrases. Usually this syndrome is mild and often may not even be diagnosed or suspected. Uncommonly the problem is severe.  CAUSES  The exact cause of TS is unknown. Genetic and environmental factors may be involved. The majority of cases of TS are inherited. No gene has been identified which causes TS. In some cases, tics may not be inherited. These cases are identified as sporadic TS. SYMPTOMS  The first symptoms of TS are usually facial tics and eye blinking. Tics may begin in one part of the body and spread. Some patients will have one tic, while other patients will have several tics. Tics may also increase in number over time. Children will often be distracted by the tics and may appear to have trouble concentrating. Other motor tics may appear, such as:  Head jerking.  Neck stretching.  Jumping  Foot stamping.  Body twisting and bending. Vocal tics are also common in a person with TS and include:  Shout.  Bark.  Yelp.  Grunt.  Sniff.  Cough.  Clear his or her throat. A person with TS may touch other people excessively or repeat actions over and over. Some TS patients also express compulsive behaviors like checking, counting, or repeating certain words. A few patients with TS have self-harming behaviors such as lip and cheek biting and head banging. People with TS can sometimes suppress their tics for a short time, but eventually tension mounts to the point where the tic must escape. Tics worsen in stressful situations and improve when the person relaxes or is absorbed in an activity.  Additionally, there is an association with attention deficit  hyperactivity disorder (50% patients with TS also have ADHD), learning disabilities (30%), and obsessive compulsive disorder (25%-40%). DIAGNOSIS  Tourette syndrome is diagnosed by observing the symptoms and evaluating family history. In order for a child to be diagnosed with TS, they must have both a motor and verbal tic for at least 12 months. TS is a diagnosis made from a study of the signs and symptoms of a disease (clinical diagnosis). There are no medical or screening tests that can help in making the diagnosis, but your physician may order other tests (EEG, MRI, CAT scan, or blood tests) to make sure your child's symptoms are not due to another condition. TREATMENT   Medication management is available for TS and its associated conditions. Not all tics need medication and no medication will completely make tics go away. Treatment decisions need to consider both the potential side effects of medication and the quality of life of the patient.  Medication is also available to help when symptoms are troubling or interfere with life. TS medications are only able to help reduce specific symptoms. Relaxation techniques and biofeedback may be useful in handling and lessening stress. Behavioral therapies include:  Skill building focuses on deficiencies in academic and social skills.  Behavioral excesses focuses on the elimination of unwanted behaviors.  Educational accommodations can include untimed tests or the use of scribes for those with handwriting problems. There is no cure for Tourette's and no medication that works for all people. Knowledge, education and understanding are uppermost in management plans for tic disorders. Supportive counseling and therapy may help:  Avoid depression.  Limit social isolation.  Improve family support. Educating the patient, family, and patient's community (friends, school, and church) are key treatment strategies. This may be all that is required in mild  cases. There is no cure for TS. The condition in many individuals improves as they mature. Although TS is generally lifelong and chronic, it generally does not get worse as children get older. In a few cases, complete remission occurs after adolescence. Document Released: 04/21/2005 Document Revised: 09/05/2013 Document Reviewed: 02/19/2009 [facility] Patient Information 2015 [place], [place]. This information is not intended to replace advice given to you by your health care provider. Make sure you discuss any questions you have with your health care provider.

## 2014-10-20 ENCOUNTER — Other Ambulatory Visit: Payer: Self-pay | Admitting: Neurology

## 2014-10-20 NOTE — Telephone Encounter (Signed)
Originally Prescribed at [facility] in June

## 2015-08-27 DIAGNOSIS — F1020 Alcohol dependence, uncomplicated: Secondary | ICD-10-CM | POA: Insufficient documentation

## 2015-08-27 DIAGNOSIS — G629 Polyneuropathy, unspecified: Secondary | ICD-10-CM | POA: Insufficient documentation

## 2015-08-27 DIAGNOSIS — F319 Bipolar disorder, unspecified: Secondary | ICD-10-CM | POA: Insufficient documentation

## 2015-08-27 DIAGNOSIS — I10 Essential (primary) hypertension: Secondary | ICD-10-CM | POA: Insufficient documentation

## 2015-08-28 DIAGNOSIS — F250 Schizoaffective disorder, bipolar type: Secondary | ICD-10-CM | POA: Diagnosis not present

## 2015-09-06 DIAGNOSIS — F250 Schizoaffective disorder, bipolar type: Secondary | ICD-10-CM | POA: Diagnosis not present

## 2015-09-17 DIAGNOSIS — F250 Schizoaffective disorder, bipolar type: Secondary | ICD-10-CM | POA: Diagnosis not present

## 2015-09-27 DIAGNOSIS — F250 Schizoaffective disorder, bipolar type: Secondary | ICD-10-CM | POA: Diagnosis not present

## 2015-10-11 DIAGNOSIS — F250 Schizoaffective disorder, bipolar type: Secondary | ICD-10-CM | POA: Diagnosis not present

## 2015-10-15 DIAGNOSIS — F312 Bipolar disorder, current episode manic severe with psychotic features: Secondary | ICD-10-CM | POA: Diagnosis not present

## 2015-11-01 DIAGNOSIS — F250 Schizoaffective disorder, bipolar type: Secondary | ICD-10-CM | POA: Diagnosis not present

## 2015-11-26 DIAGNOSIS — F250 Schizoaffective disorder, bipolar type: Secondary | ICD-10-CM | POA: Diagnosis not present

## 2015-11-26 DIAGNOSIS — F312 Bipolar disorder, current episode manic severe with psychotic features: Secondary | ICD-10-CM | POA: Diagnosis not present

## 2015-12-11 DIAGNOSIS — E1149 Type 2 diabetes mellitus with other diabetic neurological complication: Secondary | ICD-10-CM | POA: Diagnosis not present

## 2015-12-11 DIAGNOSIS — Z6828 Body mass index (BMI) 28.0-28.9, adult: Secondary | ICD-10-CM | POA: Diagnosis not present

## 2015-12-11 DIAGNOSIS — L0889 Other specified local infections of the skin and subcutaneous tissue: Secondary | ICD-10-CM | POA: Diagnosis not present

## 2015-12-12 DIAGNOSIS — F250 Schizoaffective disorder, bipolar type: Secondary | ICD-10-CM | POA: Diagnosis not present

## 2015-12-26 DIAGNOSIS — Z720 Tobacco use: Secondary | ICD-10-CM | POA: Insufficient documentation

## 2015-12-26 DIAGNOSIS — R740 Nonspecific elevation of levels of transaminase and lactic acid dehydrogenase [LDH]: Secondary | ICD-10-CM | POA: Diagnosis not present

## 2015-12-31 DIAGNOSIS — F250 Schizoaffective disorder, bipolar type: Secondary | ICD-10-CM | POA: Diagnosis not present

## 2016-01-01 DIAGNOSIS — K760 Fatty (change of) liver, not elsewhere classified: Secondary | ICD-10-CM | POA: Diagnosis not present

## 2016-01-02 DIAGNOSIS — E119 Type 2 diabetes mellitus without complications: Secondary | ICD-10-CM | POA: Diagnosis not present

## 2016-01-31 DIAGNOSIS — L814 Other melanin hyperpigmentation: Secondary | ICD-10-CM | POA: Diagnosis not present

## 2016-01-31 DIAGNOSIS — D1801 Hemangioma of skin and subcutaneous tissue: Secondary | ICD-10-CM | POA: Diagnosis not present

## 2016-01-31 DIAGNOSIS — D225 Melanocytic nevi of trunk: Secondary | ICD-10-CM | POA: Diagnosis not present

## 2016-01-31 DIAGNOSIS — L821 Other seborrheic keratosis: Secondary | ICD-10-CM | POA: Diagnosis not present

## 2016-02-04 DIAGNOSIS — F250 Schizoaffective disorder, bipolar type: Secondary | ICD-10-CM | POA: Diagnosis not present

## 2016-02-11 DIAGNOSIS — F312 Bipolar disorder, current episode manic severe with psychotic features: Secondary | ICD-10-CM | POA: Diagnosis not present

## 2016-02-21 DIAGNOSIS — F250 Schizoaffective disorder, bipolar type: Secondary | ICD-10-CM | POA: Diagnosis not present

## 2016-04-01 DIAGNOSIS — F251 Schizoaffective disorder, depressive type: Secondary | ICD-10-CM | POA: Diagnosis not present

## 2016-04-22 DIAGNOSIS — F251 Schizoaffective disorder, depressive type: Secondary | ICD-10-CM | POA: Diagnosis not present

## 2016-04-24 DIAGNOSIS — N390 Urinary tract infection, site not specified: Secondary | ICD-10-CM | POA: Diagnosis not present

## 2016-04-24 DIAGNOSIS — R8299 Other abnormal findings in urine: Secondary | ICD-10-CM | POA: Diagnosis not present

## 2016-04-24 DIAGNOSIS — E1149 Type 2 diabetes mellitus with other diabetic neurological complication: Secondary | ICD-10-CM | POA: Diagnosis not present

## 2016-04-24 DIAGNOSIS — Z125 Encounter for screening for malignant neoplasm of prostate: Secondary | ICD-10-CM | POA: Diagnosis not present

## 2016-04-24 DIAGNOSIS — Z0000 Encounter for general adult medical examination without abnormal findings: Secondary | ICD-10-CM | POA: Diagnosis not present

## 2016-05-06 DIAGNOSIS — F312 Bipolar disorder, current episode manic severe with psychotic features: Secondary | ICD-10-CM | POA: Diagnosis not present

## 2016-05-13 DIAGNOSIS — F251 Schizoaffective disorder, depressive type: Secondary | ICD-10-CM | POA: Diagnosis not present

## 2016-05-22 ENCOUNTER — Ambulatory Visit: Payer: Self-pay | Admitting: Neurology

## 2016-05-29 ENCOUNTER — Telehealth: Payer: Self-pay | Admitting: Neurology

## 2016-05-29 NOTE — Telephone Encounter (Signed)
Called the patient and left a VM asking him to call back. He will need to be r/s due to snow.

## 2016-06-16 DIAGNOSIS — I10 Essential (primary) hypertension: Secondary | ICD-10-CM | POA: Diagnosis not present

## 2016-06-16 DIAGNOSIS — Z6826 Body mass index (BMI) 26.0-26.9, adult: Secondary | ICD-10-CM | POA: Diagnosis not present

## 2016-07-29 DIAGNOSIS — F312 Bipolar disorder, current episode manic severe with psychotic features: Secondary | ICD-10-CM | POA: Diagnosis not present

## 2016-08-12 DIAGNOSIS — F332 Major depressive disorder, recurrent severe without psychotic features: Secondary | ICD-10-CM | POA: Diagnosis not present

## 2016-09-26 DIAGNOSIS — F312 Bipolar disorder, current episode manic severe with psychotic features: Secondary | ICD-10-CM | POA: Diagnosis not present

## 2016-10-22 DIAGNOSIS — F251 Schizoaffective disorder, depressive type: Secondary | ICD-10-CM | POA: Diagnosis not present

## 2016-10-29 DIAGNOSIS — E784 Other hyperlipidemia: Secondary | ICD-10-CM | POA: Diagnosis not present

## 2016-10-29 DIAGNOSIS — E1149 Type 2 diabetes mellitus with other diabetic neurological complication: Secondary | ICD-10-CM | POA: Diagnosis not present

## 2016-10-29 DIAGNOSIS — I10 Essential (primary) hypertension: Secondary | ICD-10-CM | POA: Diagnosis not present

## 2016-10-29 DIAGNOSIS — K760 Fatty (change of) liver, not elsewhere classified: Secondary | ICD-10-CM | POA: Diagnosis not present

## 2016-11-07 ENCOUNTER — Ambulatory Visit (HOSPITAL_COMMUNITY): Payer: BLUE CROSS/BLUE SHIELD | Admitting: Psychology

## 2016-11-11 DIAGNOSIS — F251 Schizoaffective disorder, depressive type: Secondary | ICD-10-CM | POA: Diagnosis not present

## 2016-12-02 DIAGNOSIS — F251 Schizoaffective disorder, depressive type: Secondary | ICD-10-CM | POA: Diagnosis not present

## 2016-12-19 DIAGNOSIS — F312 Bipolar disorder, current episode manic severe with psychotic features: Secondary | ICD-10-CM | POA: Diagnosis not present

## 2016-12-23 DIAGNOSIS — F251 Schizoaffective disorder, depressive type: Secondary | ICD-10-CM | POA: Diagnosis not present

## 2016-12-25 DIAGNOSIS — H9193 Unspecified hearing loss, bilateral: Secondary | ICD-10-CM | POA: Diagnosis not present

## 2016-12-25 DIAGNOSIS — Z6825 Body mass index (BMI) 25.0-25.9, adult: Secondary | ICD-10-CM | POA: Diagnosis not present

## 2016-12-25 DIAGNOSIS — H6123 Impacted cerumen, bilateral: Secondary | ICD-10-CM | POA: Diagnosis not present

## 2017-01-15 DIAGNOSIS — E119 Type 2 diabetes mellitus without complications: Secondary | ICD-10-CM | POA: Diagnosis not present

## 2017-01-29 DIAGNOSIS — F251 Schizoaffective disorder, depressive type: Secondary | ICD-10-CM | POA: Diagnosis not present

## 2017-01-31 DIAGNOSIS — Z23 Encounter for immunization: Secondary | ICD-10-CM | POA: Diagnosis not present

## 2017-02-17 DIAGNOSIS — F251 Schizoaffective disorder, depressive type: Secondary | ICD-10-CM | POA: Diagnosis not present

## 2017-03-13 DIAGNOSIS — F312 Bipolar disorder, current episode manic severe with psychotic features: Secondary | ICD-10-CM | POA: Diagnosis not present

## 2017-05-19 DIAGNOSIS — F251 Schizoaffective disorder, depressive type: Secondary | ICD-10-CM | POA: Diagnosis not present

## 2017-06-05 DIAGNOSIS — F312 Bipolar disorder, current episode manic severe with psychotic features: Secondary | ICD-10-CM | POA: Diagnosis not present

## 2017-06-10 DIAGNOSIS — F251 Schizoaffective disorder, depressive type: Secondary | ICD-10-CM | POA: Diagnosis not present

## 2017-06-23 DIAGNOSIS — E7849 Other hyperlipidemia: Secondary | ICD-10-CM | POA: Diagnosis not present

## 2017-06-23 DIAGNOSIS — R82998 Other abnormal findings in urine: Secondary | ICD-10-CM | POA: Diagnosis not present

## 2017-06-23 DIAGNOSIS — K760 Fatty (change of) liver, not elsewhere classified: Secondary | ICD-10-CM | POA: Diagnosis not present

## 2017-06-23 DIAGNOSIS — Z125 Encounter for screening for malignant neoplasm of prostate: Secondary | ICD-10-CM | POA: Diagnosis not present

## 2017-06-23 DIAGNOSIS — E1149 Type 2 diabetes mellitus with other diabetic neurological complication: Secondary | ICD-10-CM | POA: Diagnosis not present

## 2017-06-23 DIAGNOSIS — I10 Essential (primary) hypertension: Secondary | ICD-10-CM | POA: Diagnosis not present

## 2017-07-01 DIAGNOSIS — K760 Fatty (change of) liver, not elsewhere classified: Secondary | ICD-10-CM | POA: Diagnosis not present

## 2017-07-01 DIAGNOSIS — Z1389 Encounter for screening for other disorder: Secondary | ICD-10-CM | POA: Diagnosis not present

## 2017-07-01 DIAGNOSIS — I10 Essential (primary) hypertension: Secondary | ICD-10-CM | POA: Diagnosis not present

## 2017-07-01 DIAGNOSIS — Z0000 Encounter for general adult medical examination without abnormal findings: Secondary | ICD-10-CM | POA: Diagnosis not present

## 2017-07-01 DIAGNOSIS — E7849 Other hyperlipidemia: Secondary | ICD-10-CM | POA: Diagnosis not present

## 2017-07-01 DIAGNOSIS — E1149 Type 2 diabetes mellitus with other diabetic neurological complication: Secondary | ICD-10-CM | POA: Diagnosis not present

## 2017-07-06 DIAGNOSIS — F312 Bipolar disorder, current episode manic severe with psychotic features: Secondary | ICD-10-CM | POA: Diagnosis not present

## 2017-10-01 ENCOUNTER — Encounter: Payer: Self-pay | Admitting: Neurology

## 2017-10-01 ENCOUNTER — Ambulatory Visit: Payer: BLUE CROSS/BLUE SHIELD | Admitting: Neurology

## 2017-10-01 ENCOUNTER — Other Ambulatory Visit: Payer: Self-pay

## 2017-10-01 VITALS — BP 122/74 | Wt 186.0 lb

## 2017-10-01 DIAGNOSIS — F952 Tourette's disorder: Secondary | ICD-10-CM | POA: Diagnosis not present

## 2017-10-01 MED ORDER — PIMOZIDE 2 MG PO TABS: 2.0000 mg | ORAL_TABLET | Freq: Two times a day (BID) | ORAL | 1 refills | Status: DC

## 2017-10-01 NOTE — Progress Notes (Signed)
Reason for visit: Tourette's syndrome  Referring physician: Dr. Michaelle Guerra is a 47 y.o. male  History of present illness:  Miguel Guerra is a 47 year old right-handed white male with a history of Tourette's syndrome.  The patient is followed through psychiatry for his bipolar disorder.  He has diabetes.  The patient has had elevation in liver enzymes, he is felt to have a fatty liver issue, he is also on statin medications.  The patient has been cut back on the pimozide to a 2 mg daily dose, he felt that this controlled his Tourette's syndrome better with taking 2 mg twice daily.  He denies any vocalizations, he will occasionally have jerks of the head to one side or the other.  He had been seen previously through this office about 4 years ago and was on 2 mg twice daily Prinzide at that time.  The patient denies any issues controlling the bowels or the bladder, he has reported a 50-month history of some numbness in the hands bilaterally, he will wake up with a numbness at night.  The patient has some episodes of dizziness on his medications, he may stumble on occasion.  He denies any blackouts.  He returns to this office for an evaluation.  Past Medical History:  Diagnosis Date  . Anxiety   . Diabetes mellitus without complication ([place])    off medication x 1 month  . Heartburn   . Hematuria   . History of alcohol abuse   . Hypertension   . OCD (obsessive compulsive disorder)   . Other symptoms involving urinary system(788.99)   . Tourette disorder     Past Surgical History:  Procedure Laterality Date  . MULTIPLE TOOTH EXTRACTIONS  2011    Family History  Problem Relation Age of Onset  . Breast cancer Mother   . OCD Mother   . Skin cancer Father     Social history:  reports that he has been smoking cigars.  He has never used smokeless tobacco. He reports that he does not drink alcohol or use drugs.  Medications:  Prior to Admission medications   Medication Sig Start  Date End Date Taking? Authorizing Provider  ALPRAZolam Duanne Moron) 0.5 MG tablet Take 0.5 mg by mouth as needed.    Yes [provider]  ASPIRIN 81 PO Take 1 tablet by mouth daily.   Yes [provider]  atorvastatin (LIPITOR) 40 MG tablet Take 40 mg by mouth daily.   Yes [provider]  Dulaglutide (TRULICITY) 7.56 EP/3.2RJ SOPN Inject 1 Dose into the skin.   Yes [provider]  lamoTRIgine (LAMICTAL) 100 MG tablet Take 100 mg by mouth daily.   Yes [provider]  lisinopril (PRINIVIL,ZESTRIL) 10 MG tablet Take 5 mg by mouth daily. 1/2 tablet daily   Yes [provider]  metFORMIN (GLUCOPHAGE) 1000 MG tablet Take 1,000 mg by mouth 2 (two) times daily with a meal.   Yes [provider]  OLANZapine (ZYPREXA) 15 MG tablet Take 15 mg by mouth at bedtime.   Yes [provider]  pimozide (ORAP) 2 MG tablet Take 1 tablet (2 mg total) by mouth 2 (two) times daily. 10/01/17  Yes Kathrynn Ducking, MD      Allergies  Allergen Reactions  . Penicillins     REACTION: Swelling of Neck    ROS:  Out of a complete 14 system review of symptoms, the patient complains only of the following symptoms, and all  other reviewed systems are negative.  Numbness Dizziness Depression, anxiety, too much sleep, decreased energy, disinterest in activities, hallucinations, racing thoughts  Blood pressure 122/74, weight 186 lb (84.4 kg).  Physical Exam  General: The patient is alert and cooperative at the time of the examination.  Eyes: Pupils are equal, round, and reactive to light. Discs are flat bilaterally.  Neck: The neck is supple, no carotid bruits are noted.  Respiratory: The respiratory examination is clear.  Cardiovascular: The cardiovascular examination reveals a regular rate and rhythm, no obvious murmurs or rubs are noted.  Skin: Extremities are without significant edema.  Neurologic Exam  Mental status: The patient is  alert and oriented x 3 at the time of the examination. The patient has apparent normal recent and remote memory, with an apparently normal attention span and concentration ability.  Cranial nerves: Facial symmetry is present. There is good sensation of the face to pinprick and soft touch bilaterally. The strength of the facial muscles and the muscles to head turning and shoulder shrug are normal bilaterally. Speech is well enunciated, no aphasia or dysarthria is noted. Extraocular movements are full. Visual fields are full. The tongue is midline, and the patient has symmetric elevation of the soft palate. No obvious hearing deficits are noted.  Motor: The motor testing reveals 5 over 5 strength of all 4 extremities. Good symmetric motor tone is noted throughout.  Sensory: Sensory testing is intact to pinprick, soft touch, vibration sensation, and position sense on all 4 extremities. No evidence of extinction is noted.  Coordination: Cerebellar testing reveals good finger-nose-finger and heel-to-shin bilaterally.  Gait and station: Gait is normal. Tandem gait is slightly unsteady. Romberg is negative. No drift is seen.  Reflexes: Deep tendon reflexes are symmetric and normal bilaterally. Toes are downgoing bilaterally.   Assessment/Plan:  1.  Tourette's syndrome  2.  Bilateral hand numbness, possible carpal tunnel syndrome  The patient may go on to get the EMG and nerve conduction study in the future to evaluate his hand numbness if this is persistent.  The patient will be increased on the pimozide taking 2 mg twice daily.  He will follow-up in 6 months.  Jill Alexanders MD 10/01/2017 1:52 PM  [facility] [address] [place], [place] [address]  Phone [phone] Fax [phone]

## 2017-10-02 ENCOUNTER — Telehealth: Payer: Self-pay

## 2017-10-02 NOTE — Telephone Encounter (Signed)
Tier exception form for Pimozide 2mg  BID submitted to [employer].

## 2017-10-05 NOTE — Telephone Encounter (Signed)
Dr. Jannifer Franklin- how do you want to proceed? Goodrx will not be cheaper for pt to get rx pimozide. For a three month supply from [facility] it will cost 269.96. You mentioned he was paying around $76/month.  His insurance does not offer a tier exception to lower the cost either

## 2017-10-05 NOTE — Telephone Encounter (Signed)
I called the patient.  I indicated that his insurance company would not allow a tier exception for the same as I.  He understood.  He thanked [place] for our efforts.

## 2017-10-05 NOTE — Telephone Encounter (Signed)
I called [facility] and spoke with Joy and after being placed on hold multiple times, she was finally able to make me aware that [facility] does not offer tier exceptions. I challenged this because I have done tier exceptions in the past and asked if this was something new, but Caryl Asp could not give me an explanation. She mentioned that her supervisor told her that only medicare supplements offered tier exceptions. I asked to speak with someone else and was told she could transfer me to the Drug Utilization Dept. I spoke with Holley Bouche and she reported the same thing, that they only do authorizations and do not offer tier exceptions. She also could not tell me when this began. Will forward this to RN Terrence Dupont for her to discuss with Dr. Jannifer Franklin.

## 2017-10-26 DIAGNOSIS — F312 Bipolar disorder, current episode manic severe with psychotic features: Secondary | ICD-10-CM | POA: Diagnosis not present

## 2017-11-24 ENCOUNTER — Other Ambulatory Visit: Payer: Self-pay | Admitting: Neurology

## 2017-12-29 DIAGNOSIS — Z23 Encounter for immunization: Secondary | ICD-10-CM | POA: Diagnosis not present

## 2018-01-05 DIAGNOSIS — E119 Type 2 diabetes mellitus without complications: Secondary | ICD-10-CM | POA: Diagnosis not present

## 2018-01-18 DIAGNOSIS — F312 Bipolar disorder, current episode manic severe with psychotic features: Secondary | ICD-10-CM | POA: Diagnosis not present

## 2018-01-18 DIAGNOSIS — F251 Schizoaffective disorder, depressive type: Secondary | ICD-10-CM | POA: Diagnosis not present

## 2018-02-08 DIAGNOSIS — F251 Schizoaffective disorder, depressive type: Secondary | ICD-10-CM | POA: Diagnosis not present

## 2018-02-18 ENCOUNTER — Other Ambulatory Visit: Payer: Self-pay | Admitting: Psychiatry

## 2018-02-25 DIAGNOSIS — F251 Schizoaffective disorder, depressive type: Secondary | ICD-10-CM | POA: Diagnosis not present

## 2018-03-17 ENCOUNTER — Encounter: Payer: Self-pay | Admitting: Psychiatry

## 2018-03-17 ENCOUNTER — Ambulatory Visit: Payer: BLUE CROSS/BLUE SHIELD | Admitting: Psychiatry

## 2018-03-17 DIAGNOSIS — F4001 Agoraphobia with panic disorder: Secondary | ICD-10-CM

## 2018-03-17 DIAGNOSIS — F952 Tourette's disorder: Secondary | ICD-10-CM

## 2018-03-17 DIAGNOSIS — F4010 Social phobia, unspecified: Secondary | ICD-10-CM | POA: Diagnosis not present

## 2018-03-17 DIAGNOSIS — F250 Schizoaffective disorder, bipolar type: Secondary | ICD-10-CM | POA: Diagnosis not present

## 2018-03-17 NOTE — Progress Notes (Signed)
Miguel Guerra [id number] 08/07/1970 47 y.o.  Subjective:   Patient ID:  Miguel Guerra is a 47 y.o. (DOB Nov 25, 1970) male.  Chief Complaint:  Chief Complaint  Patient presents with  . tic disorder  . Follow-up    med changes and psych tx    HPI Miguel Guerra presents to the office today for follow-up of change from pimozide to risperidone 2mg  hs for tourette's disorder and psychosis. Risperidone has helped with the facial tics and neck movements.  Weaning off the pimozide DT cost of the meds. Sleep pretty good with meds.  Still sees shower people in the shower, 5 different people.  Can't explain that one.  Also sees images of people in other locations too.  Can see people walk in his house.  They are not always clear.  Not hearing voices for awhile.  Last AH a couple of mos ago.  May hear voices that wake him from sleep.  At first it scared him, but has gottten more used to it.  Not lasting depression.  Does has anxiety including social and panic.  Panic attacks like a heart attack and are scary.  Has had to leave store DT panic before.  Tries to avoid people.  Does feel paranoid around people, feels they watch him.  Going to gym now and enjoys the progress he sees.   Review of Systems:  Review of Systems  Neurological: Negative for tremors and weakness.  Psychiatric/Behavioral: Positive for hallucinations. Negative for agitation, behavioral problems, confusion, decreased concentration, dysphoric mood, self-injury, sleep disturbance and suicidal ideas. The patient is nervous/anxious. The patient is not hyperactive.     Medications: I have reviewed the patient's current medications.  Current Outpatient Medications  Medication Sig Dispense Refill  . ALPRAZolam (XANAX) 0.5 MG tablet Take 0.5 mg by mouth 2 (two) times daily as needed.     . ASPIRIN 81 PO Take 1 tablet by mouth daily.    Marland Kitchen atorvastatin (LIPITOR) 40 MG tablet Take 40 mg by mouth daily.    . Dulaglutide (TRULICITY) 9.17  HX/5.0VW SOPN Inject 1 Dose into the skin.    Marland Kitchen lamoTRIgine (LAMICTAL) 100 MG tablet Take 100 mg by mouth daily.    Marland Kitchen lisinopril (PRINIVIL,ZESTRIL) 10 MG tablet Take 5 mg by mouth daily. 1/2 tablet daily    . metFORMIN (GLUCOPHAGE) 1000 MG tablet Take 1,000 mg by mouth 2 (two) times daily with a meal.    . OLANZapine (ZYPREXA) 20 MG tablet TAKE 1 TABLET AT BEDTIME 90 tablet 0  . pimozide (ORAP) 2 MG tablet Take 1 tablet (2 mg total) by mouth 2 (two) times daily. 180 tablet 1  . risperiDONE (RISPERDAL) 1 MG tablet Take 2 mg by mouth at bedtime.     No current facility-administered medications for this visit.     Medication Side Effects: Other: dry mouth  Allergies:  Allergies  Allergen Reactions  . Penicillins     REACTION: Swelling of Neck    Past Medical History:  Diagnosis Date  . Anxiety   . Diabetes mellitus without complication ([place])    off medication x 1 month  . Heartburn   . Hematuria   . History of alcohol abuse   . Hypertension   . OCD (obsessive compulsive disorder)   . Other symptoms involving urinary system(788.99)   . Tourette disorder     Family History  Problem Relation Age of Onset  . Breast cancer Mother   . OCD Mother   . Skin cancer  Father     Social History   Socioeconomic History  . Marital status: Single    Spouse name: Not on file  . Number of children: 0  . Years of education: college  . Highest education level: Not on file  Occupational History  . Occupation: umemployed  Social Needs  . Financial resource strain: Not on file  . Food insecurity:    Worry: Not on file    Inability: Not on file  . Transportation needs:    Medical: Not on file    Non-medical: Not on file  Tobacco Use  . Smoking status: Current Some Day Smoker    Types: Cigars  . Smokeless tobacco: Never Used  Substance and Sexual Activity  . Alcohol use: No    Alcohol/week: 72.0 standard drinks    Types: 70 Cans of beer, 2 Shots of liquor per week    Comment:  quit 12/2012  . Drug use: No  . Sexual activity: Not on file  Lifestyle  . Physical activity:    Days per week: Not on file    Minutes per session: Not on file  . Stress: Not on file  Relationships  . Social connections:    Talks on phone: Not on file    Gets together: Not on file    Attends religious service: Not on file    Active member of club or organization: Not on file    Attends meetings of clubs or organizations: Not on file    Relationship status: Not on file  . Intimate partner violence:    Fear of current or ex partner: Not on file    Emotionally abused: Not on file    Physically abused: Not on file    Forced sexual activity: Not on file  Other Topics Concern  . Not on file  Social History Narrative  . Not on file    Past Medical History, Surgical history, Social history, and Family history were reviewed and updated as appropriate.   Please see review of systems for further details on the patient's review from today.   Objective:   Physical Exam:  There were no vitals taken for this visit.  Physical Exam  Neurological: He displays no tremor. Coordination normal.  Psychiatric: His behavior is normal. Judgment normal. His mood appears anxious. His affect is not angry and not blunt. His speech is not rapid and/or pressured. He is actively hallucinating. Thought content is paranoid. Cognition and memory are normal. He does not exhibit a depressed mood. He expresses no homicidal and no suicidal ideation.  Insight and judgment fair. He is attentive.    Lab Review:     Component Value Date/Time   NA 137 02/22/2014 0834   K 4.4 02/22/2014 0834   CL 94 (L) 02/22/2014 0834   CO2 24 02/22/2014 0834   GLUCOSE 174 (H) 02/22/2014 0834   BUN 14 02/22/2014 0834   CREATININE 1.12 02/22/2014 0834   CALCIUM 10.2 02/22/2014 0834   PROT 7.1 02/22/2014 0834   ALBUMIN 4.7 02/22/2014 0834   AST 16 02/22/2014 0834   ALT 21 02/22/2014 0834   ALKPHOS 60 02/22/2014 0834    BILITOT 0.2 02/22/2014 0834   GFRNONAA 79 02/22/2014 0834   GFRAA 92 02/22/2014 0834    No results found for: WBC, RBC, HGB, HCT, PLT, MCV, MCH, MCHC, RDW, LYMPHSABS, MONOABS, EOSABS, BASOSABS  No results found for: POCLITH, LITHIUM   Lab Results  Component Value Date   VALPROATE 55 02/22/2014     .  res Assessment: Plan:    Schizoaffective disorder, bipolar type ([place])  Social anxiety disorder  Panic disorder with agoraphobia  Tourette's disorder   Greater than 50% of face to face time with patient was spent on counseling and coordination of care. We discussed his diagnoses above and chronic psychosis with anxiety. Also his Tourette's is improving with the Risperidone, but not cured.  We could consider increasing the risperidone to try to better control the tics but they are relatively mild at this point and the risk of increasing the dose does not appear to be justified at this time.  Regarding his chronic auditory hallucinations they are improved but he has still has chronic visual hallucinations of an unusual sort.  They are not improving with higher doses of antipsychotic.  I am reluctant to go higher as I do not expect we are likely to see resolution.  We could consider clozapine but it is a much more difficult medicine to use requiring the weekly CBC due to the risk of aplastic anemia.  He is aware of that option but wishes to defer at this time.  Discussed potential metabolic side effects associated with atypical antipsychotics, as well as potential risk for movement side effects. Advised pt to contact office if movement side effects occur.  Discussed the risk of using multiple antipsychotics but he does appear to be tolerating them well at this time.   Agree with weaning off pimozide.  We discussed discussed his social anxiety and panic in public.  I am reluctant to add more medications at this particular time but we may consider this in the future.  This appointment 25  minutes  Follow-up 3 months  Lynder Parents MD, DFAPA    Please see After Visit Summary for patient specific instructions.  Future Appointments  Date Time Provider [place]  04/07/2018 12:00 PM Kathrynn Ducking, MD [facility] None  06/16/2018  1:00 PM Cottle, Billey Co., MD [facility] None    No orders of the defined types were placed in this encounter.     -------------------------------

## 2018-03-23 DIAGNOSIS — R0789 Other chest pain: Secondary | ICD-10-CM | POA: Diagnosis not present

## 2018-03-23 DIAGNOSIS — I10 Essential (primary) hypertension: Secondary | ICD-10-CM | POA: Diagnosis not present

## 2018-03-23 DIAGNOSIS — Z6825 Body mass index (BMI) 25.0-25.9, adult: Secondary | ICD-10-CM | POA: Diagnosis not present

## 2018-03-23 DIAGNOSIS — E1149 Type 2 diabetes mellitus with other diabetic neurological complication: Secondary | ICD-10-CM | POA: Diagnosis not present

## 2018-04-07 ENCOUNTER — Ambulatory Visit: Payer: BLUE CROSS/BLUE SHIELD | Admitting: Neurology

## 2018-04-14 ENCOUNTER — Other Ambulatory Visit: Payer: Self-pay | Admitting: Psychiatry

## 2018-04-14 NOTE — Telephone Encounter (Signed)
Review paper chart

## 2018-04-19 ENCOUNTER — Other Ambulatory Visit: Payer: Self-pay

## 2018-04-19 MED ORDER — RISPERIDONE 2 MG PO TABS: 2.0000 mg | ORAL_TABLET | Freq: Every day | ORAL | 1 refills | Status: DC

## 2018-04-26 ENCOUNTER — Other Ambulatory Visit: Payer: Self-pay | Admitting: Psychiatry

## 2018-05-12 ENCOUNTER — Encounter: Payer: Self-pay | Admitting: Emergency Medicine

## 2018-05-12 DIAGNOSIS — R58 Hemorrhage, not elsewhere classified: Secondary | ICD-10-CM | POA: Diagnosis not present

## 2018-05-12 DIAGNOSIS — F4010 Social phobia, unspecified: Secondary | ICD-10-CM | POA: Insufficient documentation

## 2018-05-12 DIAGNOSIS — F259 Schizoaffective disorder, unspecified: Secondary | ICD-10-CM | POA: Insufficient documentation

## 2018-05-12 DIAGNOSIS — M169 Osteoarthritis of hip, unspecified: Secondary | ICD-10-CM | POA: Diagnosis not present

## 2018-05-12 DIAGNOSIS — I10 Essential (primary) hypertension: Secondary | ICD-10-CM | POA: Diagnosis not present

## 2018-05-12 DIAGNOSIS — E1149 Type 2 diabetes mellitus with other diabetic neurological complication: Secondary | ICD-10-CM | POA: Diagnosis not present

## 2018-05-18 ENCOUNTER — Other Ambulatory Visit: Payer: Self-pay | Admitting: Psychiatry

## 2018-05-19 ENCOUNTER — Ambulatory Visit (INDEPENDENT_AMBULATORY_CARE_PROVIDER_SITE_OTHER): Payer: Self-pay | Admitting: Orthopaedic Surgery

## 2018-06-16 ENCOUNTER — Encounter: Payer: Self-pay | Admitting: Psychiatry

## 2018-06-16 ENCOUNTER — Ambulatory Visit (INDEPENDENT_AMBULATORY_CARE_PROVIDER_SITE_OTHER): Payer: BLUE CROSS/BLUE SHIELD | Admitting: Psychiatry

## 2018-06-16 DIAGNOSIS — F952 Tourette's disorder: Secondary | ICD-10-CM

## 2018-06-16 DIAGNOSIS — F4010 Social phobia, unspecified: Secondary | ICD-10-CM

## 2018-06-16 DIAGNOSIS — F4001 Agoraphobia with panic disorder: Secondary | ICD-10-CM

## 2018-06-16 DIAGNOSIS — F250 Schizoaffective disorder, bipolar type: Secondary | ICD-10-CM | POA: Diagnosis not present

## 2018-06-16 NOTE — Progress Notes (Signed)
Miguel Guerra [id number] 31-Dec-1970 48 y.o.  Subjective:   Patient ID:  Miguel Guerra is a 48 y.o. (DOB 20-Sep-1970) male.  Chief Complaint:  Chief Complaint  Patient presents with  . Follow-up    Medication Management  . Hallucinations  . Tics    HPI last seen March 17, 2018 Miguel Guerra presents to the office today for follow-up of change from pimozide to risperidone 2mg  hs for tourette's disorder and psychosis.  Completely off pimozide.  Thinks risperidone is as good or better and more cost effective.  Risperidone has helped with the facial tics and neck movements.    Overall doing ok with good days and bad days but stable compared to before.  Sleep pretty good with meds.  Still sees shower people in the shower, 5 different people.  Can't explain that one.  Also sees images of people in other locations too.  Can see people walk in his house.  They are not always clear.  Not hearing voices for awhile.  Last AH a couple of mos ago.  May hear voices that wake him from sleep.  At first it scared him, but has gottten more used to it.   Occ feels watched.  No recent voices and hallucinations no longer waking him.  Not lasting depression.  Does has anxiety including social and panic.  Panic attacks like a heart attack and are scary.  Has had to leave store DT panic before.  Tries to avoid people.  Does feel paranoid around people, feels they watch him.  Usually still going to gym now and enjoys the progress he sees.  Quit drinking.  Some weight gain.  Review of Systems:  Review of Systems  Gastrointestinal: Positive for diarrhea.  Neurological: Negative for tremors and weakness.  Psychiatric/Behavioral: Positive for hallucinations. Negative for agitation, behavioral problems, confusion, decreased concentration, dysphoric mood, self-injury, sleep disturbance and suicidal ideas. The patient is nervous/anxious. The patient is not hyperactive.     Medications: I have reviewed the  patient's current medications.  Current Outpatient Medications  Medication Sig Dispense Refill  . ALPRAZolam (XANAX) 0.5 MG tablet TAKE 1 TABLET 2 TO 3 TIMES A DAY 60 tablet 2  . ASPIRIN 81 PO Take 1 tablet by mouth daily.    Marland Kitchen atorvastatin (LIPITOR) 40 MG tablet Take 40 mg by mouth daily.    . busPIRone (BUSPAR) 30 MG tablet Take 30 mg by mouth 2 (two) times daily.    Marland Kitchen lamoTRIgine (LAMICTAL) 100 MG tablet Take 100 mg by mouth daily.    Marland Kitchen lisinopril (PRINIVIL,ZESTRIL) 10 MG tablet Take 5 mg by mouth daily. 1/2 tablet daily    . metFORMIN (GLUCOPHAGE) 1000 MG tablet Take 1,000 mg by mouth 2 (two) times daily with a meal.    . OLANZapine (ZYPREXA) 20 MG tablet TAKE 1 TABLET BY MOUTH EVERYDAY AT BEDTIME 90 tablet 0  . risperiDONE (RISPERDAL) 2 MG tablet Take 1 tablet (2 mg total) by mouth at bedtime. 60 tablet 1  . Dulaglutide (TRULICITY) 1.44 RX/5.4MG SOPN Inject 1 Dose into the skin.     No current facility-administered medications for this visit.     Medication Side Effects: Other: dry mouth  Allergies:  Allergies  Allergen Reactions  . Penicillins     REACTION: Swelling of Neck    Past Medical History:  Diagnosis Date  . Anxiety   . Diabetes mellitus without complication ([place])    off medication x 1 month  . Heartburn   . Hematuria   .  History of alcohol abuse   . Hypertension   . OCD (obsessive compulsive disorder)   . Other symptoms involving urinary system(788.99)   . Tourette disorder     Family History  Problem Relation Age of Onset  . Breast cancer Mother   . OCD Mother   . Skin cancer Father     Social History   Socioeconomic History  . Marital status: Single    Spouse name: Not on file  . Number of children: 0  . Years of education: college  . Highest education level: Not on file  Occupational History  . Occupation: umemployed  Social Needs  . Financial resource strain: Not on file  . Food insecurity:    Worry: Not on file    Inability: Not on  file  . Transportation needs:    Medical: Not on file    Non-medical: Not on file  Tobacco Use  . Smoking status: Current Some Day Smoker    Types: Cigars  . Smokeless tobacco: Never Used  Substance and Sexual Activity  . Alcohol use: No    Alcohol/week: 72.0 standard drinks    Types: 70 Cans of beer, 2 Shots of liquor per week    Comment: quit 12/2012  . Drug use: No  . Sexual activity: Not on file  Lifestyle  . Physical activity:    Days per week: Not on file    Minutes per session: Not on file  . Stress: Not on file  Relationships  . Social connections:    Talks on phone: Not on file    Gets together: Not on file    Attends religious service: Not on file    Active member of club or organization: Not on file    Attends meetings of clubs or organizations: Not on file    Relationship status: Not on file  . Intimate partner violence:    Fear of current or ex partner: Not on file    Emotionally abused: Not on file    Physically abused: Not on file    Forced sexual activity: Not on file  Other Topics Concern  . Not on file  Social History Narrative  . Not on file    Past Medical History, Surgical history, Social history, and Family history were reviewed and updated as appropriate.   Please see review of systems for further details on the patient's review from today.   Objective:   Physical Exam:  There were no vitals taken for this visit.  Physical Exam Neurological:     Motor: No tremor.     Coordination: Coordination normal.  Psychiatric:        Attention and Perception: He is attentive. He perceives visual hallucinations. He does not perceive auditory hallucinations.        Mood and Affect: Mood is anxious. Mood is not depressed. Affect is not blunt or angry.        Speech: Speech is not rapid and pressured.        Behavior: Behavior normal.        Thought Content: Thought content is paranoid. Thought content does not include homicidal or suicidal ideation.         Cognition and Memory: Cognition normal.        Judgment: Judgment normal.     Comments: Insight and judgment fair.     Lab Review:     Component Value Date/Time   NA 137 02/22/2014 0834   K 4.4 02/22/2014 0834  CL 94 (L) 02/22/2014 0834   CO2 24 02/22/2014 0834   GLUCOSE 174 (H) 02/22/2014 0834   BUN 14 02/22/2014 0834   CREATININE 1.12 02/22/2014 0834   CALCIUM 10.2 02/22/2014 0834   PROT 7.1 02/22/2014 0834   ALBUMIN 4.7 02/22/2014 0834   AST 16 02/22/2014 0834   ALT 21 02/22/2014 0834   ALKPHOS 60 02/22/2014 0834   BILITOT 0.2 02/22/2014 0834   GFRNONAA 79 02/22/2014 0834   GFRAA 92 02/22/2014 0834    No results found for: WBC, RBC, HGB, HCT, PLT, MCV, MCH, MCHC, RDW, LYMPHSABS, MONOABS, EOSABS, BASOSABS  No results found for: POCLITH, LITHIUM   Lab Results  Component Value Date   VALPROATE 55 02/22/2014     .res Assessment: Plan:    Schizoaffective disorder, bipolar type ([place])  Tourette's disorder  Panic disorder with agoraphobia  Social anxiety disorder   Greater than 50% of face to face time with patient was spent on counseling and coordination of care. We discussed his diagnoses above and chronic psychosis with anxiety. Also his Tourette's is improving with the Risperidone, but not cured.  We could consider increasing the risperidone to try to better control the tics but they are relatively mild at this point and the risk of increasing the dose does not appear to be justified at this time.  Regarding his chronic auditory hallucinations they are improved but he has still has chronic visual hallucinations of an unusual sort.  They are not improving with higher doses of antipsychotic.  I am reluctant to go higher as I do not expect we are likely to see resolution.  We could consider clozapine but it is a much more difficult medicine to use requiring the weekly CBC due to the risk of aplastic anemia.  He is aware of that option but wishes to defer at this  time.  Discussed potential metabolic side effects associated with atypical antipsychotics, as well as potential risk for movement side effects. Advised pt to contact office if movement side effects occur.  Discussed the risk of using multiple antipsychotics but he does appear to be tolerating them well at this time.  Not ideal to be using 2 antipsychotics but medically necessary in this case at least at this time.  The Zyprexa to help with hallucinations and the risperidone to help with the tic disorder  We discussed discussed his social anxiety and panic in public.  I am reluctant to add more medications at this particular time but we may consider this in the future.  Follow-up 4 months  Lynder Parents MD, DFAPA    Please see After Visit Summary for patient specific instructions.  No future appointments.  No orders of the defined types were placed in this encounter.     -------------------------------

## 2018-06-30 DIAGNOSIS — R82998 Other abnormal findings in urine: Secondary | ICD-10-CM | POA: Diagnosis not present

## 2018-06-30 DIAGNOSIS — Z0000 Encounter for general adult medical examination without abnormal findings: Secondary | ICD-10-CM | POA: Diagnosis not present

## 2018-06-30 DIAGNOSIS — E1149 Type 2 diabetes mellitus with other diabetic neurological complication: Secondary | ICD-10-CM | POA: Diagnosis not present

## 2018-06-30 DIAGNOSIS — Z125 Encounter for screening for malignant neoplasm of prostate: Secondary | ICD-10-CM | POA: Diagnosis not present

## 2018-06-30 DIAGNOSIS — E7849 Other hyperlipidemia: Secondary | ICD-10-CM | POA: Diagnosis not present

## 2018-07-07 DIAGNOSIS — Z0000 Encounter for general adult medical examination without abnormal findings: Secondary | ICD-10-CM | POA: Diagnosis not present

## 2018-07-07 DIAGNOSIS — K760 Fatty (change of) liver, not elsewhere classified: Secondary | ICD-10-CM | POA: Diagnosis not present

## 2018-07-07 DIAGNOSIS — E1149 Type 2 diabetes mellitus with other diabetic neurological complication: Secondary | ICD-10-CM | POA: Diagnosis not present

## 2018-07-07 DIAGNOSIS — E7849 Other hyperlipidemia: Secondary | ICD-10-CM | POA: Diagnosis not present

## 2018-07-07 DIAGNOSIS — I10 Essential (primary) hypertension: Secondary | ICD-10-CM | POA: Diagnosis not present

## 2018-07-07 DIAGNOSIS — Z1331 Encounter for screening for depression: Secondary | ICD-10-CM | POA: Diagnosis not present

## 2018-07-13 ENCOUNTER — Other Ambulatory Visit: Payer: Self-pay | Admitting: Internal Medicine

## 2018-07-13 DIAGNOSIS — F1020 Alcohol dependence, uncomplicated: Secondary | ICD-10-CM

## 2018-07-13 DIAGNOSIS — K760 Fatty (change of) liver, not elsewhere classified: Secondary | ICD-10-CM

## 2018-07-22 ENCOUNTER — Other Ambulatory Visit: Payer: Self-pay

## 2018-08-06 DIAGNOSIS — K760 Fatty (change of) liver, not elsewhere classified: Secondary | ICD-10-CM | POA: Diagnosis not present

## 2018-08-06 DIAGNOSIS — N181 Chronic kidney disease, stage 1: Secondary | ICD-10-CM | POA: Diagnosis not present

## 2018-08-06 DIAGNOSIS — E1149 Type 2 diabetes mellitus with other diabetic neurological complication: Secondary | ICD-10-CM | POA: Diagnosis not present

## 2018-08-06 DIAGNOSIS — I129 Hypertensive chronic kidney disease with stage 1 through stage 4 chronic kidney disease, or unspecified chronic kidney disease: Secondary | ICD-10-CM | POA: Diagnosis not present

## 2018-08-09 ENCOUNTER — Other Ambulatory Visit: Payer: Self-pay | Admitting: Psychiatry

## 2018-08-12 ENCOUNTER — Other Ambulatory Visit: Payer: Self-pay | Admitting: Psychiatry

## 2018-08-26 ENCOUNTER — Other Ambulatory Visit: Payer: Self-pay | Admitting: Psychiatry

## 2018-09-08 ENCOUNTER — Other Ambulatory Visit: Payer: Self-pay | Admitting: Psychiatry

## 2018-10-11 DIAGNOSIS — R4781 Slurred speech: Secondary | ICD-10-CM | POA: Diagnosis not present

## 2018-10-11 DIAGNOSIS — F251 Schizoaffective disorder, depressive type: Secondary | ICD-10-CM | POA: Diagnosis not present

## 2018-10-11 DIAGNOSIS — E1149 Type 2 diabetes mellitus with other diabetic neurological complication: Secondary | ICD-10-CM | POA: Diagnosis not present

## 2018-10-11 DIAGNOSIS — R278 Other lack of coordination: Secondary | ICD-10-CM | POA: Diagnosis not present

## 2018-10-11 DIAGNOSIS — I129 Hypertensive chronic kidney disease with stage 1 through stage 4 chronic kidney disease, or unspecified chronic kidney disease: Secondary | ICD-10-CM | POA: Diagnosis not present

## 2018-10-13 ENCOUNTER — Other Ambulatory Visit: Payer: Self-pay

## 2018-10-13 ENCOUNTER — Encounter: Payer: Self-pay | Admitting: Psychiatry

## 2018-10-13 ENCOUNTER — Ambulatory Visit (INDEPENDENT_AMBULATORY_CARE_PROVIDER_SITE_OTHER): Payer: BC Managed Care – PPO | Admitting: Psychiatry

## 2018-10-13 DIAGNOSIS — F952 Tourette's disorder: Secondary | ICD-10-CM | POA: Diagnosis not present

## 2018-10-13 DIAGNOSIS — F250 Schizoaffective disorder, bipolar type: Secondary | ICD-10-CM | POA: Diagnosis not present

## 2018-10-13 DIAGNOSIS — F4010 Social phobia, unspecified: Secondary | ICD-10-CM | POA: Diagnosis not present

## 2018-10-13 DIAGNOSIS — F4001 Agoraphobia with panic disorder: Secondary | ICD-10-CM | POA: Diagnosis not present

## 2018-10-13 NOTE — Progress Notes (Signed)
[name] [id number] [birth year] 48 y.o.  Subjective:   Patient ID:  Miguel Guerra is a 48 y.o. (DOB 1970/11/19) male.  Chief Complaint:  Chief Complaint  Patient presents with  . Follow-up    Medication Management  . Anxiety    Medication Management    Anxiety  Symptoms include nervous/anxious behavior. Patient reports no confusion, decreased concentration or suicidal ideas.     Miguel Guerra presents to the office today for follow-up of change from pimozide to risperidone 2mg  hs for tourette's disorder and psychosis.  Last seen Feb.  No meds changed.  Disability review was favorable and has it and it's a relief.  Atty Candace Apple helped him.  Off and on visual hallucinations of people, bugs and animals and sometimes scary.  Still gets depressed but at baseline.  Satisfied with meds.  Labs with PCP. Hyponatremia dt excessive water.  Stopped beer.  Not abusing alcohol.  Quit smoking cigarettes after smoking 2ppd for 16 years.  Already feels better.  Overall doing ok with good days and bad days but stable compared to before.  Sleep pretty good with meds.  Still sees shower people in the shower, 5 different people.  Can't explain that one.  Also sees images of people in other locations too.  Can see people walk in his house.  They are not always clear.  Not hearing voices for awhile.  Last AH a couple of mos ago.  May hear voices that wake him from sleep.  Sleeping better off cigarettes.  At first it scared him, but has gottten more used to it.   Occ feels watched.  No recent voices and hallucinations no longer waking him.  Not lasting depression.  Does have anxiety including social and panic.  Panic attacks like a heart attack and are scary.  Has had to leave store DT panic before.  Tries to avoid people.  Does feel paranoid around people, feels they watch him.  Usually still going to gym now and enjoys the progress he sees.  Quit drinking.  Some weight gain. Quit smoking and  caffeine.  Only taking 0.5 mg Xanax daily. Not abusing.  Pending MRI head bc slurred speech and can't write anymore and hard to read.  ? Ministroke.  Had speech therapy as akid.  Past Psychiatric Medication Trials:    Review of Systems:  Review of Systems  Gastrointestinal: Positive for diarrhea.  Neurological: Negative for tremors and weakness.  Psychiatric/Behavioral: Positive for hallucinations. Negative for agitation, behavioral problems, confusion, decreased concentration, dysphoric mood, self-injury, sleep disturbance and suicidal ideas. The patient is nervous/anxious. The patient is not hyperactive.     Medications: I have reviewed the patient's current medications.  Current Outpatient Medications  Medication Sig Dispense Refill  . ALPRAZolam (XANAX) 0.5 MG tablet TAKE 1 TABLET BY MOUTH 2 TO 3 TIMES DAILY 60 tablet 2  . ASPIRIN 81 PO Take 325 tablets by mouth daily.     Marland Kitchen atorvastatin (LIPITOR) 40 MG tablet Take 40 mg by mouth daily.    . busPIRone (BUSPAR) 30 MG tablet Take 30 mg by mouth 2 (two) times daily.    . Dulaglutide (TRULICITY) 7.78 EU/2.3NT SOPN Inject 1 Dose into the skin.    Marland Kitchen lamoTRIgine (LAMICTAL) 100 MG tablet Take 100 mg by mouth daily.    Marland Kitchen lisinopril (PRINIVIL,ZESTRIL) 10 MG tablet Take 10 mg by mouth daily.     Marland Kitchen OLANZapine (ZYPREXA) 20 MG tablet TAKE 1 TABLET BY MOUTH EVERYDAY AT BEDTIME 90  tablet 1  . risperiDONE (RISPERDAL) 2 MG tablet TAKE 1 TABLET (2 MG TOTAL) BY MOUTH AT BEDTIME. 90 tablet 1  . hydrochlorothiazide (HYDRODIURIL) 25 MG tablet Take 25 mg by mouth daily.    . pimozide (ORAP) 2 MG tablet Take 2 mg by mouth daily.     No current facility-administered medications for this visit.     Medication Side Effects: Other: dry mouth  Allergies:  Allergies  Allergen Reactions  . Penicillins     REACTION: Swelling of Neck    Past Medical History:  Diagnosis Date  . Anxiety   . Diabetes mellitus without complication ([place])    off  medication x 1 month  . Heartburn   . Hematuria   . History of alcohol abuse   . Hypertension   . OCD (obsessive compulsive disorder)   . Other symptoms involving urinary system(788.99)   . Tourette disorder     Family History  Problem Relation Age of Onset  . Breast cancer Mother   . OCD Mother   . Skin cancer Father     Social History   Socioeconomic History  . Marital status: Single    Spouse name: Not on file  . Number of children: 0  . Years of education: college  . Highest education level: Not on file  Occupational History  . Occupation: umemployed  Social Needs  . Financial resource strain: Not on file  . Food insecurity:    Worry: Not on file    Inability: Not on file  . Transportation needs:    Medical: Not on file    Non-medical: Not on file  Tobacco Use  . Smoking status: Current Some Day Smoker    Types: Cigars  . Smokeless tobacco: Never Used  Substance and Sexual Activity  . Alcohol use: No    Alcohol/week: 72.0 standard drinks    Types: 70 Cans of beer, 2 Shots of liquor per week    Comment: quit 12/2012  . Drug use: No  . Sexual activity: Not on file  Lifestyle  . Physical activity:    Days per week: Not on file    Minutes per session: Not on file  . Stress: Not on file  Relationships  . Social connections:    Talks on phone: Not on file    Gets together: Not on file    Attends religious service: Not on file    Active member of club or organization: Not on file    Attends meetings of clubs or organizations: Not on file    Relationship status: Not on file  . Intimate partner violence:    Fear of current or ex partner: Not on file    Emotionally abused: Not on file    Physically abused: Not on file    Forced sexual activity: Not on file  Other Topics Concern  . Not on file  Social History Narrative  . Not on file    Past Medical History, Surgical history, Social history, and Family history were reviewed and updated as appropriate.    Please see review of systems for further details on the patient's review from today.   Objective:   Physical Exam:  There were no vitals taken for this visit.  Physical Exam Neurological:     Motor: No tremor.     Coordination: Coordination normal.  Psychiatric:        Attention and Perception: He is attentive. He perceives visual hallucinations. He does not perceive auditory  hallucinations.        Mood and Affect: Mood is anxious. Mood is not depressed. Affect is not blunt or angry.        Speech: Speech is not rapid and pressured or slurred.        Behavior: Behavior normal.        Thought Content: Thought content is paranoid. Thought content does not include homicidal or suicidal ideation.        Cognition and Memory: Cognition normal.        Judgment: Judgment normal.     Comments: Insight and judgment fair. Speech is not significantly slurred in the office.     Lab Review:     Component Value Date/Time   NA 137 02/22/2014 0834   K 4.4 02/22/2014 0834   CL 94 (L) 02/22/2014 0834   CO2 24 02/22/2014 0834   GLUCOSE 174 (H) 02/22/2014 0834   BUN 14 02/22/2014 0834   CREATININE 1.12 02/22/2014 0834   CALCIUM 10.2 02/22/2014 0834   PROT 7.1 02/22/2014 0834   ALBUMIN 4.7 02/22/2014 0834   AST 16 02/22/2014 0834   ALT 21 02/22/2014 0834   ALKPHOS 60 02/22/2014 0834   BILITOT 0.2 02/22/2014 0834   GFRNONAA 79 02/22/2014 0834   GFRAA 92 02/22/2014 0834    No results found for: WBC, RBC, HGB, HCT, PLT, MCV, MCH, MCHC, RDW, LYMPHSABS, MONOABS, EOSABS, BASOSABS  No results found for: POCLITH, LITHIUM   Lab Results  Component Value Date   VALPROATE 55 02/22/2014     .res Assessment: Plan:    Schizoaffective disorder, bipolar type ([place])  Tourette's disorder  Panic disorder with agoraphobia  Social anxiety disorder   Greater than 50% of face to face time with patient was spent on counseling and coordination of care. We discussed his diagnoses above and  chronic psychosis with anxiety. Also his Tourette's is improving with the Risperidone, but not cured.  We could consider increasing the risperidone to try to better control the tics but they are relatively mild at this point and the risk of increasing the dose does not appear to be justified at this time.  Overall he appears to be at baseline.  Regarding his chronic auditory hallucinations they are improved but he has still has chronic visual hallucinations of an unusual sort.  They are not improving with higher doses of antipsychotic.  I am reluctant to go higher as I do not expect we are likely to see resolution.  We could consider clozapine but it is a much more difficult medicine to use requiring the weekly CBC due to the risk of aplastic anemia.  He is aware of that option but wishes to defer at this time.  Discussed potential metabolic side effects associated with atypical antipsychotics, as well as potential risk for movement side effects. Advised pt to contact office if movement side effects occur.  Discussed the risk of using multiple antipsychotics but he does appear to be tolerating them well at this time.  Not ideal to be using 2 antipsychotics but medically necessary in this case at least at this time.  The Zyprexa to help with hallucinations and the risperidone to help with the tic disorder  We discussed discussed his social anxiety and panic in public.  I am reluctant to add more medications at this particular time but we may consider this in the future.  Agree with MRI head and neuro workup.  New sx slurred speech without reason and difficulty reading and  writing.  No medication changes indicated.   Follow-up 4 months  Lynder Parents MD, DFAPA    Please see After Visit Summary for patient specific instructions.  No future appointments.  No orders of the defined types were placed in this encounter.     -------------------------------

## 2018-11-18 ENCOUNTER — Encounter (HOSPITAL_COMMUNITY): Payer: Self-pay | Admitting: Psychology

## 2019-01-05 DIAGNOSIS — E785 Hyperlipidemia, unspecified: Secondary | ICD-10-CM | POA: Diagnosis not present

## 2019-01-05 DIAGNOSIS — R4781 Slurred speech: Secondary | ICD-10-CM | POA: Diagnosis not present

## 2019-01-05 DIAGNOSIS — R740 Nonspecific elevation of levels of transaminase and lactic acid dehydrogenase [LDH]: Secondary | ICD-10-CM | POA: Diagnosis not present

## 2019-01-05 DIAGNOSIS — E1149 Type 2 diabetes mellitus with other diabetic neurological complication: Secondary | ICD-10-CM | POA: Diagnosis not present

## 2019-01-05 DIAGNOSIS — I10 Essential (primary) hypertension: Secondary | ICD-10-CM | POA: Diagnosis not present

## 2019-01-06 DIAGNOSIS — E1149 Type 2 diabetes mellitus with other diabetic neurological complication: Secondary | ICD-10-CM | POA: Diagnosis not present

## 2019-01-27 DIAGNOSIS — R0609 Other forms of dyspnea: Secondary | ICD-10-CM | POA: Diagnosis not present

## 2019-01-27 DIAGNOSIS — R253 Fasciculation: Secondary | ICD-10-CM | POA: Diagnosis not present

## 2019-01-27 DIAGNOSIS — F1020 Alcohol dependence, uncomplicated: Secondary | ICD-10-CM | POA: Diagnosis not present

## 2019-01-27 DIAGNOSIS — E1149 Type 2 diabetes mellitus with other diabetic neurological complication: Secondary | ICD-10-CM | POA: Diagnosis not present

## 2019-01-27 DIAGNOSIS — K760 Fatty (change of) liver, not elsewhere classified: Secondary | ICD-10-CM | POA: Diagnosis not present

## 2019-01-27 DIAGNOSIS — I10 Essential (primary) hypertension: Secondary | ICD-10-CM | POA: Diagnosis not present

## 2019-01-27 DIAGNOSIS — R7989 Other specified abnormal findings of blood chemistry: Secondary | ICD-10-CM | POA: Diagnosis not present

## 2019-02-01 ENCOUNTER — Other Ambulatory Visit: Payer: Self-pay | Admitting: Internal Medicine

## 2019-02-01 DIAGNOSIS — R7989 Other specified abnormal findings of blood chemistry: Secondary | ICD-10-CM

## 2019-02-02 ENCOUNTER — Other Ambulatory Visit: Payer: Self-pay | Admitting: Psychiatry

## 2019-02-04 ENCOUNTER — Other Ambulatory Visit: Payer: Self-pay | Admitting: Psychiatry

## 2019-02-08 ENCOUNTER — Ambulatory Visit: Payer: BC Managed Care – PPO | Admitting: Psychiatry

## 2019-02-15 ENCOUNTER — Other Ambulatory Visit: Payer: Self-pay

## 2019-02-16 ENCOUNTER — Other Ambulatory Visit: Payer: Self-pay

## 2019-02-16 ENCOUNTER — Encounter: Payer: Self-pay | Admitting: Psychiatry

## 2019-02-16 ENCOUNTER — Other Ambulatory Visit: Payer: Self-pay | Admitting: Psychiatry

## 2019-02-16 ENCOUNTER — Ambulatory Visit (INDEPENDENT_AMBULATORY_CARE_PROVIDER_SITE_OTHER): Payer: PPO | Admitting: Psychiatry

## 2019-02-16 DIAGNOSIS — F4010 Social phobia, unspecified: Secondary | ICD-10-CM

## 2019-02-16 DIAGNOSIS — F250 Schizoaffective disorder, bipolar type: Secondary | ICD-10-CM | POA: Diagnosis not present

## 2019-02-16 DIAGNOSIS — F952 Tourette's disorder: Secondary | ICD-10-CM | POA: Diagnosis not present

## 2019-02-16 DIAGNOSIS — F4001 Agoraphobia with panic disorder: Secondary | ICD-10-CM | POA: Diagnosis not present

## 2019-02-16 MED ORDER — ALPRAZOLAM 0.5 MG PO TABS: 0.5000 mg | ORAL_TABLET | Freq: Three times a day (TID) | ORAL | 2 refills | Status: DC | PRN

## 2019-02-16 NOTE — Progress Notes (Signed)
Miguel Guerra [id number] 09/03/1970 48 y.o.  Subjective:   Patient ID:  Miguel Guerra is a 48 y.o. (DOB Feb 25, 1971) male.  Chief Complaint:  Chief Complaint  Patient presents with  . Follow-up    Medication Management  . Anxiety    Medication Management  . Other    Schizoaffective disorder    Anxiety Symptoms include nervous/anxious behavior. Patient reports no confusion, decreased concentration or suicidal ideas.     Miguel Guerra presents to the office today for follow-up of  tourette's disorder and psychosis.  Last seen October 13, 2018.  No meds changed.    consistent with meds.  Status quo with sx as noted below.    Orap helps with tics but doesn't eliminate them.  It's awkward socially some but he doesn't have friends or go out in public much anyway bc of Covid.  I notice it all the time but has had facial and head tics since childhood.  Neurologist originally Rx the Orap and then PCP took it over.    Off and on visual hallucinations of people, bugs and animals and sometimes scary.  Still gets depressed but at baseline.  Satisfied with meds.  Labs with PCP. Hyponatremia dt excessive water.  Stopped beer.  Not abusing alcohol.  Quit smoking cigarettes after smoking 2ppd for 16 years.  Already feels better.  Overall doing ok with good days and bad days but stable compared to before.  Sleep pretty good with meds.  Still sees shower people in the shower, 5 different people.  Can't explain that one.  Also sees images of people in other locations too.  Can see people walk in his house.  They are not always clear.  Not hearing voices for awhile.  Last AH a couple of mos ago.  May hear voices that wake him from sleep.  Sleeping better off cigarettes.  At first it scared him, but has gottten more used to it.   Occ feels watched.  No recent voices and hallucinations no longer waking him.  Not lasting depression.  Does have anxiety including social and panic.  Panic attacks like a heart  attack and are scary.  Has had to leave store DT panic before.  Tries to avoid people.  Does feel paranoid around people, feels they watch him.  Usually still going to gym now and enjoys the progress he sees.  Quit drinking.  Some weight gain. Quit smoking and caffeine.  Only taking 0.5 mg Xanax daily. Not abusing.  Can still have panic in public.  Pending MRI head bc slurred speech and can't write anymore and hard to read.  ? Ministroke.  Had speech therapy as akid.  Patient on disability  Past Psychiatric Medication Trials:    Review of Systems:  Review of Systems  Gastrointestinal: Negative for diarrhea.  Neurological: Negative for tremors and weakness.  Psychiatric/Behavioral: Positive for hallucinations. Negative for agitation, behavioral problems, confusion, decreased concentration, dysphoric mood, self-injury, sleep disturbance and suicidal ideas. The patient is nervous/anxious. The patient is not hyperactive.     Medications: I have reviewed the patient's current medications.  Current Outpatient Medications  Medication Sig Dispense Refill  . ALPRAZolam (XANAX) 0.5 MG tablet Take 1 tablet (0.5 mg total) by mouth 3 (three) times daily as needed for anxiety. 60 tablet 2  . ASPIRIN 81 PO Take 325 tablets by mouth daily.     Marland Kitchen atorvastatin (LIPITOR) 40 MG tablet Take 40 mg by mouth daily.    . busPIRone (BUSPAR)  30 MG tablet Take 30 mg by mouth 2 (two) times daily.    . Dulaglutide (TRULICITY) A999333 0000000 SOPN Inject 1 Dose into the skin.    Marland Kitchen FARXIGA 10 MG TABS tablet     . hydrochlorothiazide (HYDRODIURIL) 25 MG tablet Take 25 mg by mouth daily.    Marland Kitchen lamoTRIgine (LAMICTAL) 100 MG tablet Take 100 mg by mouth daily.    Marland Kitchen lisinopril (PRINIVIL,ZESTRIL) 10 MG tablet Take 10 mg by mouth daily.     Marland Kitchen OLANZapine (ZYPREXA) 20 MG tablet TAKE 1 TABLET BY MOUTH EVERYDAY AT BEDTIME 90 tablet 0  . pimozide (ORAP) 2 MG tablet Take 2 mg by mouth daily.    Marland Kitchen VASCEPA 1 g CAPS Take 2 capsules  by mouth 2 (two) times daily.     No current facility-administered medications for this visit.     Medication Side Effects: Other: dry mouth  Allergies:  Allergies  Allergen Reactions  . Penicillins     REACTION: Swelling of Neck    Past Medical History:  Diagnosis Date  . Anxiety   . Diabetes mellitus without complication ([place])    off medication x 1 month  . Heartburn   . Hematuria   . History of alcohol abuse   . Hypertension   . OCD (obsessive compulsive disorder)   . Other symptoms involving urinary system(788.99)   . Tourette disorder     Family History  Problem Relation Age of Onset  . Breast cancer Mother   . OCD Mother   . Skin cancer Father     Social History   Socioeconomic History  . Marital status: Single    Spouse name: Not on file  . Number of children: 0  . Years of education: college  . Highest education level: Not on file  Occupational History  . Occupation: umemployed  Social Needs  . Financial resource strain: Not on file  . Food insecurity    Worry: Not on file    Inability: Not on file  . Transportation needs    Medical: Not on file    Non-medical: Not on file  Tobacco Use  . Smoking status: Current Some Day Smoker    Types: Cigars  . Smokeless tobacco: Never Used  Substance and Sexual Activity  . Alcohol use: No    Alcohol/week: 72.0 standard drinks    Types: 70 Cans of beer, 2 Shots of liquor per week    Comment: quit 12/2012  . Drug use: No  . Sexual activity: Not on file  Lifestyle  . Physical activity    Days per week: Not on file    Minutes per session: Not on file  . Stress: Not on file  Relationships  . Social Herbalist on phone: Not on file    Gets together: Not on file    Attends religious service: Not on file    Active member of club or organization: Not on file    Attends meetings of clubs or organizations: Not on file    Relationship status: Not on file  . Intimate partner violence    Fear of  current or ex partner: Not on file    Emotionally abused: Not on file    Physically abused: Not on file    Forced sexual activity: Not on file  Other Topics Concern  . Not on file  Social History Narrative  . Not on file    Past Medical History, Surgical history, Social history,  and Family history were reviewed and updated as appropriate.   Please see review of systems for further details on the patient's review from today.   Objective:   Physical Exam:  There were no vitals taken for this visit.  Physical Exam Neurological:     Mental Status: He is alert and oriented to person, place, and time.     Cranial Nerves: No dysarthria.  Psychiatric:        Attention and Perception: He perceives auditory and visual hallucinations.        Mood and Affect: Mood is anxious.        Speech: Speech normal.        Behavior: Behavior is cooperative.        Thought Content: Thought content is paranoid. Thought content is not delusional. Thought content does not include homicidal or suicidal ideation. Thought content does not include homicidal or suicidal plan.        Cognition and Memory: Cognition and memory normal.        Judgment: Judgment normal.     Comments: Insight fair.  Hallucinations are at baseline.     Lab Review:     Component Value Date/Time   NA 137 02/22/2014 0834   K 4.4 02/22/2014 0834   CL 94 (L) 02/22/2014 0834   CO2 24 02/22/2014 0834   GLUCOSE 174 (H) 02/22/2014 0834   BUN 14 02/22/2014 0834   CREATININE 1.12 02/22/2014 0834   CALCIUM 10.2 02/22/2014 0834   PROT 7.1 02/22/2014 0834   ALBUMIN 4.7 02/22/2014 0834   AST 16 02/22/2014 0834   ALT 21 02/22/2014 0834   ALKPHOS 60 02/22/2014 0834   BILITOT 0.2 02/22/2014 0834   GFRNONAA 79 02/22/2014 0834   GFRAA 92 02/22/2014 0834    No results found for: WBC, RBC, HGB, HCT, PLT, MCV, MCH, MCHC, RDW, LYMPHSABS, MONOABS, EOSABS, BASOSABS  No results found for: POCLITH, LITHIUM   Lab Results  Component Value  Date   VALPROATE 55 02/22/2014     .res Assessment: Plan:    Schizoaffective disorder, bipolar type ([place])  Tourette's disorder  Panic disorder with agoraphobia - Plan: ALPRAZolam (XANAX) 0.5 MG tablet  Social anxiety disorder   Greater than 50% of 30-minute phone to phone with patient was spent on counseling and coordination of care. We discussed his diagnoses above and chronic psychosis with anxiety. Chronic visual hallucinations scary at night but no worse and limited AH at this time.  Overall he appears to be at baseline.  Regarding his chronic auditory hallucinations they are improved but he has still has chronic visual hallucinations of an unusual sort.  They are not improving with higher doses of antipsychotic.  I am reluctant to go higher as I do not expect we are likely to see resolution.  We could consider clozapine but it is a much more difficult medicine to use requiring the weekly CBC due to the risk of aplastic anemia.  He is aware of that option but wishes to defer at this time.  Discussed potential metabolic side effects associated with atypical antipsychotics, as well as potential risk for movement side effects. Advised pt to contact office if movement side effects occur.  Discussed the risk of using multiple antipsychotics but he does appear to be tolerating them well at this time.  Even less ideal now that he is on 3 antipsychotics.  The Zyprexa to help with hallucinations and the risperidone to help with the tic disorder but his primary  doctor restarted Orap.  This probably makes it unnecessary to continue risperidone. No medication changes indicated except stop risperidone bc using Orap for tics now.    Let us know if psychotic features worsen off risperidone. We discussed discussed his social anxiety and panic in public.  I am reluctant to add more medications at this particular time but we may consider this in the future.  Follow-up 6 months  Lynder Parents MD,  DFAPA    Please see After Visit Summary for patient specific instructions.  No future appointments.  No orders of the defined types were placed in this encounter.     -------------------------------

## 2019-03-16 DIAGNOSIS — E118 Type 2 diabetes mellitus with unspecified complications: Secondary | ICD-10-CM | POA: Diagnosis not present

## 2019-03-16 DIAGNOSIS — R7989 Other specified abnormal findings of blood chemistry: Secondary | ICD-10-CM | POA: Diagnosis not present

## 2019-03-16 DIAGNOSIS — I10 Essential (primary) hypertension: Secondary | ICD-10-CM | POA: Diagnosis not present

## 2019-03-16 DIAGNOSIS — K760 Fatty (change of) liver, not elsewhere classified: Secondary | ICD-10-CM | POA: Diagnosis not present

## 2019-03-16 DIAGNOSIS — F1020 Alcohol dependence, uncomplicated: Secondary | ICD-10-CM | POA: Diagnosis not present

## 2019-03-17 ENCOUNTER — Other Ambulatory Visit: Payer: Self-pay | Admitting: Internal Medicine

## 2019-03-17 DIAGNOSIS — K760 Fatty (change of) liver, not elsewhere classified: Secondary | ICD-10-CM

## 2019-04-06 ENCOUNTER — Other Ambulatory Visit: Payer: Self-pay | Admitting: Psychiatry

## 2019-04-06 DIAGNOSIS — F4001 Agoraphobia with panic disorder: Secondary | ICD-10-CM

## 2019-04-13 DIAGNOSIS — F1020 Alcohol dependence, uncomplicated: Secondary | ICD-10-CM | POA: Diagnosis not present

## 2019-04-13 DIAGNOSIS — R7989 Other specified abnormal findings of blood chemistry: Secondary | ICD-10-CM | POA: Diagnosis not present

## 2019-04-13 DIAGNOSIS — E118 Type 2 diabetes mellitus with unspecified complications: Secondary | ICD-10-CM | POA: Diagnosis not present

## 2019-04-13 DIAGNOSIS — R253 Fasciculation: Secondary | ICD-10-CM | POA: Diagnosis not present

## 2019-04-13 DIAGNOSIS — I10 Essential (primary) hypertension: Secondary | ICD-10-CM | POA: Diagnosis not present

## 2019-04-13 DIAGNOSIS — K760 Fatty (change of) liver, not elsewhere classified: Secondary | ICD-10-CM | POA: Diagnosis not present

## 2019-04-27 ENCOUNTER — Other Ambulatory Visit: Payer: Self-pay | Admitting: Psychiatry

## 2019-07-06 DIAGNOSIS — Z125 Encounter for screening for malignant neoplasm of prostate: Secondary | ICD-10-CM | POA: Diagnosis not present

## 2019-07-06 DIAGNOSIS — E7849 Other hyperlipidemia: Secondary | ICD-10-CM | POA: Diagnosis not present

## 2019-07-06 DIAGNOSIS — E1149 Type 2 diabetes mellitus with other diabetic neurological complication: Secondary | ICD-10-CM | POA: Diagnosis not present

## 2019-07-13 DIAGNOSIS — G629 Polyneuropathy, unspecified: Secondary | ICD-10-CM | POA: Diagnosis not present

## 2019-07-13 DIAGNOSIS — I10 Essential (primary) hypertension: Secondary | ICD-10-CM | POA: Diagnosis not present

## 2019-07-13 DIAGNOSIS — F952 Tourette's disorder: Secondary | ICD-10-CM | POA: Diagnosis not present

## 2019-07-13 DIAGNOSIS — F17201 Nicotine dependence, unspecified, in remission: Secondary | ICD-10-CM | POA: Diagnosis not present

## 2019-07-13 DIAGNOSIS — R82998 Other abnormal findings in urine: Secondary | ICD-10-CM | POA: Diagnosis not present

## 2019-07-13 DIAGNOSIS — R7401 Elevation of levels of liver transaminase levels: Secondary | ICD-10-CM | POA: Diagnosis not present

## 2019-07-13 DIAGNOSIS — F1020 Alcohol dependence, uncomplicated: Secondary | ICD-10-CM | POA: Diagnosis not present

## 2019-07-13 DIAGNOSIS — E1149 Type 2 diabetes mellitus with other diabetic neurological complication: Secondary | ICD-10-CM | POA: Diagnosis not present

## 2019-07-13 DIAGNOSIS — E785 Hyperlipidemia, unspecified: Secondary | ICD-10-CM | POA: Diagnosis not present

## 2019-07-13 DIAGNOSIS — F319 Bipolar disorder, unspecified: Secondary | ICD-10-CM | POA: Diagnosis not present

## 2019-07-13 DIAGNOSIS — K760 Fatty (change of) liver, not elsewhere classified: Secondary | ICD-10-CM | POA: Diagnosis not present

## 2019-07-13 DIAGNOSIS — Z0000 Encounter for general adult medical examination without abnormal findings: Secondary | ICD-10-CM | POA: Diagnosis not present

## 2019-07-13 DIAGNOSIS — R0789 Other chest pain: Secondary | ICD-10-CM | POA: Diagnosis not present

## 2019-07-19 ENCOUNTER — Other Ambulatory Visit: Payer: Self-pay | Admitting: Internal Medicine

## 2019-07-19 DIAGNOSIS — F1020 Alcohol dependence, uncomplicated: Secondary | ICD-10-CM

## 2019-07-19 DIAGNOSIS — R748 Abnormal levels of other serum enzymes: Secondary | ICD-10-CM

## 2019-07-24 ENCOUNTER — Other Ambulatory Visit: Payer: Self-pay | Admitting: Psychiatry

## 2019-07-29 ENCOUNTER — Ambulatory Visit: Payer: PPO | Attending: Internal Medicine

## 2019-07-29 DIAGNOSIS — Z23 Encounter for immunization: Secondary | ICD-10-CM

## 2019-07-29 NOTE — Progress Notes (Signed)
   Covid-19 Vaccination Clinic  Name:  Miguel Guerra    MRN: [id number] DOB: 09-24-1970  07/29/2019  Miguel Guerra was observed post Covid-19 immunization for 30 minutes based on pre-vaccination screening without incident. He was provided with Vaccine Information Sheet and instruction to access the V-Safe system.   Miguel Guerra was instructed to call 911 with any severe reactions post vaccine: Marland Kitchen Difficulty breathing  . Swelling of face and throat  . A fast heartbeat  . A bad rash all over body  . Dizziness and weakness   Immunizations Administered    Name Date Dose VIS Date Route   Pfizer COVID-19 Vaccine 07/29/2019  1:31 PM 0.3 mL 04/15/2019 Intramuscular   Manufacturer: [place]   Lot: [id number]   [place]: [id number]

## 2019-08-11 ENCOUNTER — Telehealth: Payer: Self-pay | Admitting: Neurology

## 2019-08-11 NOTE — Telephone Encounter (Signed)
Pt is a returning pt of Dr. Jannifer Franklin and being referred back to the office for Tourette's. He would like to switch his care from Dr. Jannifer Franklin to Dr. Leta Baptist  Please advise if ok to switch.   Thank You

## 2019-08-23 ENCOUNTER — Ambulatory Visit: Payer: PPO | Attending: Internal Medicine

## 2019-08-23 DIAGNOSIS — Z23 Encounter for immunization: Secondary | ICD-10-CM

## 2019-08-23 NOTE — Progress Notes (Signed)
   Covid-19 Vaccination Clinic  Name:  Miguel Guerra    MRN: [id number] DOB: 1970-11-17  08/23/2019  [name] was observed post Covid-19 immunization for 15 minutes without incident. He was provided with Vaccine Information Sheet and instruction to access the V-Safe system.   [name] was instructed to call 911 with any severe reactions post vaccine: Marland Kitchen Difficulty breathing  . Swelling of face and throat  . A fast heartbeat  . A bad rash all over body  . Dizziness and weakness   Immunizations Administered    Name Date Dose VIS Date Route   Pfizer COVID-19 Vaccine 08/23/2019  1:08 PM 0.3 mL 06/29/2018 Intramuscular   Manufacturer: [place]   Lot: [id number]   [place]: [id number]

## 2019-09-23 NOTE — Patient Outreach (Signed)
Received a pharmacy referral from Dr. Brigitte Pulse, Patient has [employer] insurance. I have sent this referral to [facility] through their [employer] portal.

## 2019-10-16 ENCOUNTER — Other Ambulatory Visit: Payer: Self-pay | Admitting: Psychiatry

## 2019-11-09 ENCOUNTER — Other Ambulatory Visit: Payer: Self-pay | Admitting: Psychiatry

## 2019-11-09 DIAGNOSIS — F4001 Agoraphobia with panic disorder: Secondary | ICD-10-CM

## 2019-11-09 NOTE — Telephone Encounter (Signed)
Last apt was 02/2019 was due back 6 months

## 2019-11-10 DIAGNOSIS — E1149 Type 2 diabetes mellitus with other diabetic neurological complication: Secondary | ICD-10-CM | POA: Diagnosis not present

## 2019-11-10 DIAGNOSIS — I10 Essential (primary) hypertension: Secondary | ICD-10-CM | POA: Diagnosis not present

## 2019-11-10 DIAGNOSIS — E669 Obesity, unspecified: Secondary | ICD-10-CM | POA: Insufficient documentation

## 2019-11-10 DIAGNOSIS — K760 Fatty (change of) liver, not elsewhere classified: Secondary | ICD-10-CM | POA: Diagnosis not present

## 2019-11-10 DIAGNOSIS — E785 Hyperlipidemia, unspecified: Secondary | ICD-10-CM | POA: Diagnosis not present

## 2019-11-10 DIAGNOSIS — F319 Bipolar disorder, unspecified: Secondary | ICD-10-CM | POA: Diagnosis not present

## 2019-11-10 DIAGNOSIS — F1020 Alcohol dependence, uncomplicated: Secondary | ICD-10-CM | POA: Diagnosis not present

## 2019-11-11 NOTE — Telephone Encounter (Signed)
Call to RS

## 2019-11-11 NOTE — Telephone Encounter (Signed)
He is RS for 1st available 9/22

## 2019-12-21 DIAGNOSIS — C44612 Basal cell carcinoma of skin of right upper limb, including shoulder: Secondary | ICD-10-CM | POA: Diagnosis not present

## 2019-12-21 DIAGNOSIS — D485 Neoplasm of uncertain behavior of skin: Secondary | ICD-10-CM | POA: Diagnosis not present

## 2019-12-21 DIAGNOSIS — C44729 Squamous cell carcinoma of skin of left lower limb, including hip: Secondary | ICD-10-CM | POA: Diagnosis not present

## 2020-01-06 DIAGNOSIS — D0472 Carcinoma in situ of skin of left lower limb, including hip: Secondary | ICD-10-CM | POA: Diagnosis not present

## 2020-01-14 ENCOUNTER — Other Ambulatory Visit: Payer: Self-pay | Admitting: Psychiatry

## 2020-01-16 NOTE — Telephone Encounter (Signed)
Review.

## 2020-01-18 DIAGNOSIS — C44612 Basal cell carcinoma of skin of right upper limb, including shoulder: Secondary | ICD-10-CM | POA: Diagnosis not present

## 2020-01-25 ENCOUNTER — Other Ambulatory Visit: Payer: Self-pay

## 2020-01-25 ENCOUNTER — Encounter: Payer: Self-pay | Admitting: Psychiatry

## 2020-01-25 ENCOUNTER — Ambulatory Visit (INDEPENDENT_AMBULATORY_CARE_PROVIDER_SITE_OTHER): Payer: PPO | Admitting: Psychiatry

## 2020-01-25 DIAGNOSIS — F250 Schizoaffective disorder, bipolar type: Secondary | ICD-10-CM

## 2020-01-25 DIAGNOSIS — F952 Tourette's disorder: Secondary | ICD-10-CM

## 2020-01-25 DIAGNOSIS — F4001 Agoraphobia with panic disorder: Secondary | ICD-10-CM

## 2020-01-25 DIAGNOSIS — F4010 Social phobia, unspecified: Secondary | ICD-10-CM | POA: Diagnosis not present

## 2020-01-25 MED ORDER — ALPRAZOLAM 0.5 MG PO TABS: 0.5000 mg | ORAL_TABLET | Freq: Three times a day (TID) | ORAL | 0 refills | Status: DC | PRN

## 2020-01-25 MED ORDER — OLANZAPINE 20 MG PO TABS: 20.0000 mg | ORAL_TABLET | Freq: Every day | ORAL | 1 refills | Status: DC

## 2020-01-25 NOTE — Progress Notes (Signed)
Miguel Guerra [id number] 08/22/1970 49 y.o.  Subjective:   Patient ID:  Miguel Guerra is a 49 y.o. (DOB [birth year]) male.  Chief Complaint:  Chief Complaint  Patient presents with  . Follow-up    Medication Management  . Other    Schizoaffective disorder, bipolar type  . Hallucinations    Anxiety Symptoms include nervous/anxious behavior. Patient reports no confusion, decreased concentration, palpitations or suicidal ideas.     Miguel Guerra presents to the office today for follow-up of  tourette's disorder and psychosis.  02/2019 appt with the following noted: consistent with meds.  Status quo with sx as noted below.   Orap helps with tics but doesn't eliminate them.  It's awkward socially some but he doesn't have friends or go out in public much anyway bc of Covid.  I notice it all the time but has had facial and head tics since childhood.  Neurologist originally Rx the Orap and then PCP took it over.   Off and on visual hallucinations of people, bugs and animals and sometimes scary.  Still gets depressed but at baseline.  Satisfied with meds. Labs with PCP. Hyponatremia dt excessive water.  Stopped beer.  Not abusing alcohol.  Quit smoking cigarettes after smoking 2ppd for 16 years.  Already feels better. Overall doing ok with good days and bad days but stable compared to before. Sleep pretty good with meds.  Still sees shower people in the shower, 5 different people.  Can't explain that one.  Also sees images of people in other locations too.  Can see people walk in his house.  They are not always clear.  Not hearing voices for awhile.  Last AH a couple of mos ago.  May hear voices that wake him from sleep.  Sleeping better off cigarettes.  At first it scared him, but has gottten more used to it.   Occ feels watched.  No recent voices and hallucinations no longer waking him.  Not lasting depression.  Does have anxiety including social and panic.  Panic attacks like a heart attack  and are scary.  Has had to leave store DT panic before.  Tries to avoid people.  Does feel paranoid around people, feels they watch him.  Usually still going to gym now and enjoys the progress he sees. Quit drinking.  Some weight gain. Quit smoking and caffeine. Only taking 0.5 mg Xanax daily. Not abusing.  Can still have panic in public. Pending MRI head bc slurred speech and can't write anymore and hard to read.  ? Ministroke.  Had speech therapy as akid. Plan: No medication changes indicated except stop risperidone bc using Orap for tics now.    01/25/20 appt with the following noted: Had 2 weeks straight of seeing a person in front of him, usually a man, but then it went away.  Happened at home and parents' home.  It did not talk.  Frightens him when it happens.  You'd think I'd be used to it.  occ hears voices but overall feels psychotic sx are manageable. Stopped Orap bc of money. Sleeps well with olanzapine.   No beer in 8 mos.  Occ glass of wine at dinner.  Lost 20# off alcohol.  Also on a diet.  Never drank liquor.   Takes Xanax every AM for anxiety.   Tics involve oral and facial and neck movements and masks help hide this so is ok off Orap which he couldn't afford. He may want to try meds later.  Patient on disability  Past Psychiatric Medication Trials: risperidone, Orap   Review of Systems:  Review of Systems  Cardiovascular: Negative for palpitations.  Gastrointestinal: Positive for abdominal distention. Negative for diarrhea.  Neurological: Negative for tremors and weakness.  Psychiatric/Behavioral: Positive for hallucinations. Negative for agitation, behavioral problems, confusion, decreased concentration, dysphoric mood, self-injury, sleep disturbance and suicidal ideas. The patient is nervous/anxious. The patient is not hyperactive.     Medications: I have reviewed the patient's current medications.  Current Outpatient Medications  Medication Sig Dispense Refill  .  ALPRAZolam (XANAX) 0.5 MG tablet Take 1 tablet (0.5 mg total) by mouth 3 (three) times daily as needed for anxiety. 60 tablet 0  . ASPIRIN 81 PO Take 325 tablets by mouth daily.     Marland Kitchen atorvastatin (LIPITOR) 40 MG tablet Take 40 mg by mouth daily.    . hydrochlorothiazide (HYDRODIURIL) 25 MG tablet Take 25 mg by mouth daily.    Marland Kitchen lamoTRIgine (LAMICTAL) 100 MG tablet Take 100 mg by mouth daily.    Marland Kitchen lisinopril (PRINIVIL,ZESTRIL) 10 MG tablet Take 10 mg by mouth daily.     . metFORMIN (GLUCOPHAGE) 500 MG tablet Take 500 mg by mouth 2 (two) times daily.    Marland Kitchen OLANZapine (ZYPREXA) 20 MG tablet Take 1 tablet (20 mg total) by mouth at bedtime. 90 tablet 1  . VASCEPA 1 g CAPS Take 2 capsules by mouth 2 (two) times daily.     No current facility-administered medications for this visit.    Medication Side Effects: Other: dry mouth  Allergies:  Allergies  Allergen Reactions  . Penicillins     REACTION: Swelling of Neck    Past Medical History:  Diagnosis Date  . Anxiety   . Diabetes mellitus without complication ([place])    off medication x 1 month  . Heartburn   . Hematuria   . History of alcohol abuse   . Hypertension   . OCD (obsessive compulsive disorder)   . Other symptoms involving urinary system(788.99)   . Tourette disorder     Family History  Problem Relation Age of Onset  . Breast cancer Mother   . OCD Mother   . Skin cancer Father     Social History   Socioeconomic History  . Marital status: Single    Spouse name: Not on file  . Number of children: 0  . Years of education: college  . Highest education level: Not on file  Occupational History  . Occupation: umemployed  Tobacco Use  . Smoking status: Current Some Day Smoker    Types: Cigars  . Smokeless tobacco: Never Used  Substance and Sexual Activity  . Alcohol use: No    Alcohol/week: 72.0 standard drinks    Types: 70 Cans of beer, 2 Shots of liquor per week    Comment: quit 12/2012  . Drug use: No  .  Sexual activity: Not on file  Other Topics Concern  . Not on file  Social History Narrative  . Not on file   Social Determinants of Health   Financial Resource Strain:   . Difficulty of Paying Living Expenses: Not on file  Food Insecurity:   . Worried About Charity fundraiser in the Last Year: Not on file  . Ran Out of Food in the Last Year: Not on file  Transportation Needs:   . Lack of Transportation (Medical): Not on file  . Lack of Transportation (Non-Medical): Not on file  Physical Activity:   .  Days of Exercise per Week: Not on file  . Minutes of Exercise per Session: Not on file  Stress:   . Feeling of Stress : Not on file  Social Connections:   . Frequency of Communication with Friends and Family: Not on file  . Frequency of Social Gatherings with Friends and Family: Not on file  . Attends Religious Services: Not on file  . Active Member of Clubs or Organizations: Not on file  . Attends Archivist Meetings: Not on file  . Marital Status: Not on file  Intimate Partner Violence:   . Fear of Current or Ex-Partner: Not on file  . Emotionally Abused: Not on file  . Physically Abused: Not on file  . Sexually Abused: Not on file    Past Medical History, Surgical history, Social history, and Family history were reviewed and updated as appropriate.   Please see review of systems for further details on the patient's review from today.   Objective:   Physical Exam:  There were no vitals taken for this visit.  Physical Exam Constitutional:      General: He is not in acute distress. Musculoskeletal:        General: No deformity.  Neurological:     Mental Status: He is alert and oriented to person, place, and time.     Cranial Nerves: No dysarthria.     Coordination: Coordination normal.  Psychiatric:        Attention and Perception: Attention normal. He perceives auditory and visual hallucinations.        Mood and Affect: Mood is anxious. Mood is not  depressed. Affect is not labile, blunt, angry or inappropriate.        Speech: Speech normal.        Behavior: Behavior normal. Behavior is cooperative.        Thought Content: Thought content is paranoid. Thought content is not delusional. Thought content does not include homicidal or suicidal ideation. Thought content does not include homicidal or suicidal plan.        Cognition and Memory: Cognition and memory normal.        Judgment: Judgment normal.     Comments: Insight fair.  Hallucinations are at baseline.     Lab Review:     Component Value Date/Time   NA 137 02/22/2014 0834   K 4.4 02/22/2014 0834   CL 94 (L) 02/22/2014 0834   CO2 24 02/22/2014 0834   GLUCOSE 174 (H) 02/22/2014 0834   BUN 14 02/22/2014 0834   CREATININE 1.12 02/22/2014 0834   CALCIUM 10.2 02/22/2014 0834   PROT 7.1 02/22/2014 0834   ALBUMIN 4.7 02/22/2014 0834   AST 16 02/22/2014 0834   ALT 21 02/22/2014 0834   ALKPHOS 60 02/22/2014 0834   BILITOT 0.2 02/22/2014 0834   GFRNONAA 79 02/22/2014 0834   GFRAA 92 02/22/2014 0834    No results found for: WBC, RBC, HGB, HCT, PLT, MCV, MCH, MCHC, RDW, LYMPHSABS, MONOABS, EOSABS, BASOSABS  No results found for: POCLITH, LITHIUM   Lab Results  Component Value Date   VALPROATE 55 02/22/2014     .res Assessment: Plan:    Schizoaffective disorder, bipolar type ([place]) - Plan: OLANZapine (ZYPREXA) 20 MG tablet  Tourette's disorder  Panic disorder with agoraphobia - Plan: ALPRAZolam (XANAX) 0.5 MG tablet  Social anxiety disorder   Greater than 50% of 30-minute phone to phone with patient was spent on counseling and coordination of care. We discussed his diagnoses above  and chronic psychosis with anxiety. Chronic visual hallucinations scary at night but no worse and limited AH at this time.  Overall he appears to be at baseline.  Regarding his chronic auditory hallucinations they are improved but he has still has chronic visual hallucinations of an  unusual sort.  They are not improving with higher doses of antipsychotic.  I am reluctant to go higher as I do not expect we are likely to see resolution.  We could consider clozapine but it is a much more difficult medicine to use requiring the weekly CBC due to the risk of aplastic anemia.  He is aware of that option but wishes to defer at this time.  Discussed potential metabolic side effects associated with atypical antipsychotics, as well as potential risk for movement side effects. Advised pt to contact office if movement side effects occur.  Discussed the risk of using multiple antipsychotics but he does appear to be tolerating them well at this time.  The Zyprexa to help with hallucinations  Continue olanzapine 20 mg HS He's not sure but thinks he stopped the lamotrigine.  Tics are tolerable off the risperidone and Orap.   We discussed discussed his social anxiety and panic in public.  I am reluctant to add more medications at this particular time but we may consider this in the future.  Follow-up 6 months  Lynder Parents MD, DFAPA    Please see After Visit Summary for patient specific instructions.  No future appointments.  No orders of the defined types were placed in this encounter.     -------------------------------

## 2020-03-09 DIAGNOSIS — R7401 Elevation of levels of liver transaminase levels: Secondary | ICD-10-CM | POA: Diagnosis not present

## 2020-03-09 DIAGNOSIS — E669 Obesity, unspecified: Secondary | ICD-10-CM | POA: Diagnosis not present

## 2020-03-09 DIAGNOSIS — K760 Fatty (change of) liver, not elsewhere classified: Secondary | ICD-10-CM | POA: Diagnosis not present

## 2020-03-09 DIAGNOSIS — I10 Essential (primary) hypertension: Secondary | ICD-10-CM | POA: Diagnosis not present

## 2020-03-09 DIAGNOSIS — F319 Bipolar disorder, unspecified: Secondary | ICD-10-CM | POA: Diagnosis not present

## 2020-03-09 DIAGNOSIS — G629 Polyneuropathy, unspecified: Secondary | ICD-10-CM | POA: Diagnosis not present

## 2020-03-09 DIAGNOSIS — F1020 Alcohol dependence, uncomplicated: Secondary | ICD-10-CM | POA: Diagnosis not present

## 2020-03-09 DIAGNOSIS — E1149 Type 2 diabetes mellitus with other diabetic neurological complication: Secondary | ICD-10-CM | POA: Diagnosis not present

## 2020-03-09 DIAGNOSIS — E785 Hyperlipidemia, unspecified: Secondary | ICD-10-CM | POA: Diagnosis not present

## 2020-04-04 DIAGNOSIS — N181 Chronic kidney disease, stage 1: Secondary | ICD-10-CM | POA: Diagnosis not present

## 2020-04-04 DIAGNOSIS — F319 Bipolar disorder, unspecified: Secondary | ICD-10-CM | POA: Diagnosis not present

## 2020-04-04 DIAGNOSIS — E1149 Type 2 diabetes mellitus with other diabetic neurological complication: Secondary | ICD-10-CM | POA: Diagnosis not present

## 2020-04-04 DIAGNOSIS — I129 Hypertensive chronic kidney disease with stage 1 through stage 4 chronic kidney disease, or unspecified chronic kidney disease: Secondary | ICD-10-CM | POA: Diagnosis not present

## 2020-04-18 DIAGNOSIS — D1801 Hemangioma of skin and subcutaneous tissue: Secondary | ICD-10-CM | POA: Diagnosis not present

## 2020-04-18 DIAGNOSIS — L570 Actinic keratosis: Secondary | ICD-10-CM | POA: Diagnosis not present

## 2020-04-18 DIAGNOSIS — D229 Melanocytic nevi, unspecified: Secondary | ICD-10-CM | POA: Diagnosis not present

## 2020-04-18 DIAGNOSIS — L905 Scar conditions and fibrosis of skin: Secondary | ICD-10-CM | POA: Diagnosis not present

## 2020-04-18 DIAGNOSIS — B354 Tinea corporis: Secondary | ICD-10-CM | POA: Diagnosis not present

## 2020-04-18 DIAGNOSIS — Z85828 Personal history of other malignant neoplasm of skin: Secondary | ICD-10-CM | POA: Diagnosis not present

## 2020-04-18 DIAGNOSIS — L821 Other seborrheic keratosis: Secondary | ICD-10-CM | POA: Diagnosis not present

## 2020-04-23 ENCOUNTER — Other Ambulatory Visit: Payer: Self-pay | Admitting: Psychiatry

## 2020-04-23 DIAGNOSIS — F4001 Agoraphobia with panic disorder: Secondary | ICD-10-CM

## 2020-07-10 DIAGNOSIS — E785 Hyperlipidemia, unspecified: Secondary | ICD-10-CM | POA: Diagnosis not present

## 2020-07-10 DIAGNOSIS — E1149 Type 2 diabetes mellitus with other diabetic neurological complication: Secondary | ICD-10-CM | POA: Diagnosis not present

## 2020-07-10 DIAGNOSIS — Z125 Encounter for screening for malignant neoplasm of prostate: Secondary | ICD-10-CM | POA: Diagnosis not present

## 2020-07-13 DIAGNOSIS — E119 Type 2 diabetes mellitus without complications: Secondary | ICD-10-CM | POA: Diagnosis not present

## 2020-07-13 DIAGNOSIS — H524 Presbyopia: Secondary | ICD-10-CM | POA: Diagnosis not present

## 2020-07-13 DIAGNOSIS — H5213 Myopia, bilateral: Secondary | ICD-10-CM | POA: Diagnosis not present

## 2020-07-17 DIAGNOSIS — R82998 Other abnormal findings in urine: Secondary | ICD-10-CM | POA: Diagnosis not present

## 2020-07-17 DIAGNOSIS — E785 Hyperlipidemia, unspecified: Secondary | ICD-10-CM | POA: Diagnosis not present

## 2020-07-17 DIAGNOSIS — R6889 Other general symptoms and signs: Secondary | ICD-10-CM | POA: Diagnosis not present

## 2020-07-17 DIAGNOSIS — I10 Essential (primary) hypertension: Secondary | ICD-10-CM | POA: Diagnosis not present

## 2020-07-17 DIAGNOSIS — Z0000 Encounter for general adult medical examination without abnormal findings: Secondary | ICD-10-CM | POA: Diagnosis not present

## 2020-07-17 DIAGNOSIS — F17201 Nicotine dependence, unspecified, in remission: Secondary | ICD-10-CM | POA: Diagnosis not present

## 2020-07-17 DIAGNOSIS — F952 Tourette's disorder: Secondary | ICD-10-CM | POA: Diagnosis not present

## 2020-07-17 DIAGNOSIS — F1020 Alcohol dependence, uncomplicated: Secondary | ICD-10-CM | POA: Diagnosis not present

## 2020-07-17 DIAGNOSIS — R7401 Elevation of levels of liver transaminase levels: Secondary | ICD-10-CM | POA: Diagnosis not present

## 2020-07-17 DIAGNOSIS — D696 Thrombocytopenia, unspecified: Secondary | ICD-10-CM | POA: Diagnosis not present

## 2020-07-17 DIAGNOSIS — E1149 Type 2 diabetes mellitus with other diabetic neurological complication: Secondary | ICD-10-CM | POA: Diagnosis not present

## 2020-07-17 DIAGNOSIS — E669 Obesity, unspecified: Secondary | ICD-10-CM | POA: Diagnosis not present

## 2020-07-17 DIAGNOSIS — Z1331 Encounter for screening for depression: Secondary | ICD-10-CM | POA: Diagnosis not present

## 2020-07-17 DIAGNOSIS — Z1339 Encounter for screening examination for other mental health and behavioral disorders: Secondary | ICD-10-CM | POA: Diagnosis not present

## 2020-07-17 DIAGNOSIS — K760 Fatty (change of) liver, not elsewhere classified: Secondary | ICD-10-CM | POA: Diagnosis not present

## 2020-07-23 ENCOUNTER — Encounter: Payer: Self-pay | Admitting: Psychiatry

## 2020-07-23 ENCOUNTER — Ambulatory Visit (INDEPENDENT_AMBULATORY_CARE_PROVIDER_SITE_OTHER): Payer: PPO | Admitting: Psychiatry

## 2020-07-23 ENCOUNTER — Other Ambulatory Visit: Payer: Self-pay

## 2020-07-23 DIAGNOSIS — F952 Tourette's disorder: Secondary | ICD-10-CM | POA: Diagnosis not present

## 2020-07-23 DIAGNOSIS — F250 Schizoaffective disorder, bipolar type: Secondary | ICD-10-CM

## 2020-07-23 DIAGNOSIS — F4010 Social phobia, unspecified: Secondary | ICD-10-CM | POA: Diagnosis not present

## 2020-07-23 DIAGNOSIS — F4001 Agoraphobia with panic disorder: Secondary | ICD-10-CM | POA: Diagnosis not present

## 2020-07-23 MED ORDER — ALPRAZOLAM 0.5 MG PO TABS: 0.5000 mg | ORAL_TABLET | Freq: Three times a day (TID) | ORAL | 2 refills | Status: DC | PRN

## 2020-07-23 MED ORDER — OLANZAPINE 20 MG PO TABS: 20.0000 mg | ORAL_TABLET | Freq: Every day | ORAL | 1 refills | Status: DC

## 2020-07-23 NOTE — Progress Notes (Signed)
Miguel Guerra [id number] [birth year] 50 y.o.  Subjective:   Patient ID:  Miguel Guerra is a 50 y.o. (DOB 05-15-1970) male.  Chief Complaint:  Chief Complaint  Patient presents with  . Follow-up  . Schizoaffective disorder, bipolar type (Miguel Guerra)    Anxiety Symptoms include nervous/anxious behavior. Patient reports no confusion, decreased concentration, palpitations or suicidal ideas.     Miguel Guerra presents to the office today for follow-up of  tourette's disorder and psychosis.  02/2019 appt with the following noted: consistent with meds.  Status quo with sx as noted below.   Orap helps with tics but doesn't eliminate them.  It's awkward socially some but he doesn't have friends or go out in public much anyway bc of Covid.  I notice it all the time but has had facial and head tics since childhood.  Neurologist originally Rx the Orap and then PCP took it over.   Off and on visual hallucinations of people, bugs and animals and sometimes scary.  Still gets depressed but at baseline.  Satisfied with meds. Labs with PCP. Hyponatremia dt excessive water.  Stopped beer.  Not abusing alcohol.  Quit smoking cigarettes after smoking 2ppd for 16 years.  Already feels better. Overall doing ok with good days and bad days but stable compared to before. Sleep pretty good with meds.  Still sees shower people in the shower, 5 different people.  Can't explain that one.  Also sees images of people in other locations too.  Can see people walk in his house.  They are not always clear.  Not hearing voices for awhile.  Last AH a couple of mos ago.  May hear voices that wake him from sleep.  Sleeping better off cigarettes.  At first it scared him, but has gottten more used to it.   Occ feels watched.  No recent voices and hallucinations no longer waking him.  Not lasting depression.  Does have anxiety including social and panic.  Panic attacks like a heart attack and are scary.  Has had to leave store DT panic  before.  Tries to avoid people.  Does feel paranoid around people, feels they watch him.  Usually still going to gym now and enjoys the progress he sees. Quit drinking.  Some weight gain. Quit smoking and caffeine. Only taking 0.5 mg Xanax daily. Not abusing.  Can still have panic in public. Pending MRI head bc slurred speech and can't write anymore and hard to read.  ? Ministroke.  Had speech therapy as akid. Plan: No medication changes indicated except stop risperidone bc using Orap for tics now.    01/25/20 appt with the following noted: Had 2 weeks straight of seeing a person in front of him, usually a man, but then it went away.  Happened at home and parents' home.  It did not talk.  Frightens him when it happens.  You'd think I'd be used to it.  occ hears voices but overall feels psychotic sx are manageable. Stopped Orap bc of money. Sleeps well with olanzapine.   No beer in 8 mos.  Occ glass of wine at dinner.  Lost 20# off alcohol.  Also on a diet.  Never drank liquor.   Takes Xanax every AM for anxiety.   Tics involve oral and facial and neck movements and masks help hide this so is ok off Orap which he couldn't afford. He may want to try meds later. Plan: Continue olanzapine 20 mg HS He's not sure but thinks he  stopped the lamotrigine.   07/23/2020 appointment with the following noted: Hanging in there.  About the same.  Still see things in the house but handles it.  Day or night can feel something is looking at me.  Turns around and sees it briefly and it dissipates.  Not talking to him.  Nothing too scary.   Anxiety managed with Xanax AM and rarely thereafter.   Occ depression but not all the time. No problems with olanzapine which helps him sleep.  Taking meds for DM.  Had DM for 6-7 years he thinks.  Patient on disability  Past Psychiatric Medication Trials: risperidone, Orap $, lamotrigine   Review of Systems:  Review of Systems  Cardiovascular: Negative for palpitations.   Gastrointestinal: Positive for abdominal distention. Negative for diarrhea.  Neurological: Negative for tremors and weakness.  Psychiatric/Behavioral: Positive for hallucinations. Negative for agitation, behavioral problems, confusion, decreased concentration, dysphoric mood, self-injury, sleep disturbance and suicidal ideas. The patient is nervous/anxious. The patient is not hyperactive.     Medications: I have reviewed the patient's current medications.  Current Outpatient Medications  Medication Sig Dispense Refill  . atorvastatin (LIPITOR) 40 MG tablet Take 40 mg by mouth daily.    Marland Kitchen lisinopril (PRINIVIL,ZESTRIL) 10 MG tablet Take 10 mg by mouth daily.     . metFORMIN (GLUCOPHAGE) 500 MG tablet Take 500 mg by mouth 2 (two) times daily.    Marland Kitchen ALPRAZolam (XANAX) 0.5 MG tablet Take 1 tablet (0.5 mg total) by mouth 3 (three) times daily as needed for anxiety. 60 tablet 2  . ASPIRIN 81 PO Take 325 tablets by mouth daily.  (Patient not taking: Reported on 07/23/2020)    . hydrochlorothiazide (HYDRODIURIL) 25 MG tablet Take 25 mg by mouth daily. (Patient not taking: Reported on 07/23/2020)    . OLANZapine (ZYPREXA) 20 MG tablet Take 1 tablet (20 mg total) by mouth at bedtime. 90 tablet 1  . VASCEPA 1 g CAPS Take 2 capsules by mouth 2 (two) times daily.     No current facility-administered medications for this visit.    Medication Side Effects: Other: dry mouth  Allergies:  Allergies  Allergen Reactions  . Penicillins     REACTION: Swelling of Neck    Past Medical History:  Diagnosis Date  . Anxiety   . Diabetes mellitus without complication ([place])    off medication x 1 month  . Heartburn   . Hematuria   . History of alcohol abuse   . Hypertension   . OCD (obsessive compulsive disorder)   . Other symptoms involving urinary system(788.99)   . Tourette disorder     Family History  Problem Relation Age of Onset  . Breast cancer Mother   . OCD Mother   . Skin cancer Father      Social History   Socioeconomic History  . Marital status: Single    Spouse name: Not on file  . Number of children: 0  . Years of education: college  . Highest education level: Not on file  Occupational History  . Occupation: umemployed  Tobacco Use  . Smoking status: Current Some Day Smoker    Types: Cigars  . Smokeless tobacco: Never Used  Substance and Sexual Activity  . Alcohol use: No    Alcohol/week: 72.0 standard drinks    Types: 70 Cans of beer, 2 Shots of liquor per week    Comment: quit 12/2012  . Drug use: No  . Sexual activity: Not on file  Other  Topics Concern  . Not on file  Social History Narrative  . Not on file   Social Determinants of Health   Financial Resource Strain: Not on file  Food Insecurity: Not on file  Transportation Needs: Not on file  Physical Activity: Not on file  Stress: Not on file  Social Connections: Not on file  Intimate Partner Violence: Not on file    Past Medical History, Surgical history, Social history, and Family history were reviewed and updated as appropriate.   Please see review of systems for further details on the patient's review from today.   Objective:   Physical Exam:  There were no vitals taken for this visit.  Physical Exam Constitutional:      General: He is not in acute distress. Musculoskeletal:        General: No deformity.  Neurological:     Mental Status: He is alert and oriented to person, place, and time.     Cranial Nerves: No dysarthria.     Coordination: Coordination normal.  Psychiatric:        Attention and Perception: Attention normal. He perceives visual hallucinations. He does not perceive auditory hallucinations.        Mood and Affect: Mood is anxious. Mood is not depressed. Affect is not labile, blunt, angry or inappropriate.        Speech: Speech normal.        Behavior: Behavior normal. Behavior is cooperative.        Thought Content: Thought content is paranoid. Thought content  is not delusional. Thought content does not include homicidal or suicidal ideation. Thought content does not include homicidal or suicidal plan.        Cognition and Memory: Cognition and memory normal.        Judgment: Judgment normal.     Comments: Insight fair.  Hallucinations are at baseline.     Lab Review:     Component Value Date/Time   NA 137 02/22/2014 0834   K 4.4 02/22/2014 0834   CL 94 (L) 02/22/2014 0834   CO2 24 02/22/2014 0834   GLUCOSE 174 (H) 02/22/2014 0834   BUN 14 02/22/2014 0834   CREATININE 1.12 02/22/2014 0834   CALCIUM 10.2 02/22/2014 0834   PROT 7.1 02/22/2014 0834   ALBUMIN 4.7 02/22/2014 0834   AST 16 02/22/2014 0834   ALT 21 02/22/2014 0834   ALKPHOS 60 02/22/2014 0834   BILITOT 0.2 02/22/2014 0834   GFRNONAA 79 02/22/2014 0834   GFRAA 92 02/22/2014 0834    No results found for: WBC, RBC, HGB, HCT, PLT, MCV, MCH, MCHC, RDW, LYMPHSABS, MONOABS, EOSABS, BASOSABS  No results found for: POCLITH, LITHIUM   Lab Results  Component Value Date   VALPROATE 55 02/22/2014     .res Assessment: Plan:    Schizoaffective disorder, bipolar type ([place]) - Plan: OLANZapine (ZYPREXA) 20 MG tablet  Panic disorder with agoraphobia - Plan: ALPRAZolam (XANAX) 0.5 MG tablet  Social anxiety disorder  Tourette's disorder   Greater than 50% of 30-minute phone to phone with patient was spent on counseling and coordination of care. We discussed his diagnoses above and chronic psychosis with anxiety. Chronic visual hallucinations scary at night but no worse and limited AH at this time.  Overall he appears to be at baseline.  Regarding his chronic auditory hallucinations they are improved but he has still has chronic visual hallucinations of an unusual sort.  They are not improving with higher doses of antipsychotic.  I  am reluctant to go higher as I do not expect we are likely to see resolution.  We could consider clozapine but it is a much more difficult medicine to  use requiring the weekly CBC due to the risk of aplastic anemia.  He is aware of that option but wishes to defer at this time.  Discussed potential metabolic side effects associated with atypical antipsychotics, as well as potential risk for movement side effects. Advised pt to contact office if movement side effects occur.  Discussed the risk of using multiple antipsychotics but he does appear to be tolerating them well at this time.  The Zyprexa to help with hallucinations  Continue olanzapine 20 mg HS  And alprazolam.  No abuse apparent.  We discussed the short-term risks associated with benzodiazepines including sedation and increased fall risk among others.  Discussed long-term side effect risk including dependence, potential withdrawal symptoms, and the potential eventual dose-related risk of dementia.  But recent studies from 2020 dispute this association between benzodiazepines and dementia risk. Newer studies in 2020 do not support an association with dementia.  Tics are tolerable off the risperidone and Orap. He feels it's manageable at present.  We discussed discussed his social anxiety and panic in public.  I am reluctant to add more medications at this particular time but we may consider this in the future.   Follow-up 6 months  Lynder Parents MD, DFAPA    Please see After Visit Summary for patient specific instructions.  No future appointments.  No orders of the defined types were placed in this encounter.     -------------------------------

## 2020-08-16 DIAGNOSIS — E785 Hyperlipidemia, unspecified: Secondary | ICD-10-CM | POA: Diagnosis not present

## 2020-08-16 DIAGNOSIS — Z794 Long term (current) use of insulin: Secondary | ICD-10-CM | POA: Diagnosis not present

## 2020-08-16 DIAGNOSIS — E1149 Type 2 diabetes mellitus with other diabetic neurological complication: Secondary | ICD-10-CM | POA: Diagnosis not present

## 2020-08-16 DIAGNOSIS — I10 Essential (primary) hypertension: Secondary | ICD-10-CM | POA: Diagnosis not present

## 2020-08-30 DIAGNOSIS — H9193 Unspecified hearing loss, bilateral: Secondary | ICD-10-CM | POA: Diagnosis not present

## 2020-08-30 DIAGNOSIS — H6123 Impacted cerumen, bilateral: Secondary | ICD-10-CM | POA: Diagnosis not present

## 2020-09-06 DIAGNOSIS — F17201 Nicotine dependence, unspecified, in remission: Secondary | ICD-10-CM | POA: Diagnosis not present

## 2020-09-06 DIAGNOSIS — D696 Thrombocytopenia, unspecified: Secondary | ICD-10-CM | POA: Diagnosis not present

## 2020-09-06 DIAGNOSIS — E785 Hyperlipidemia, unspecified: Secondary | ICD-10-CM | POA: Diagnosis not present

## 2020-09-06 DIAGNOSIS — E1149 Type 2 diabetes mellitus with other diabetic neurological complication: Secondary | ICD-10-CM | POA: Diagnosis not present

## 2020-09-06 DIAGNOSIS — R042 Hemoptysis: Secondary | ICD-10-CM | POA: Diagnosis not present

## 2020-09-06 DIAGNOSIS — I10 Essential (primary) hypertension: Secondary | ICD-10-CM | POA: Diagnosis not present

## 2020-09-06 DIAGNOSIS — F1020 Alcohol dependence, uncomplicated: Secondary | ICD-10-CM | POA: Diagnosis not present

## 2020-09-06 DIAGNOSIS — K760 Fatty (change of) liver, not elsewhere classified: Secondary | ICD-10-CM | POA: Diagnosis not present

## 2020-09-06 DIAGNOSIS — R7401 Elevation of levels of liver transaminase levels: Secondary | ICD-10-CM | POA: Diagnosis not present

## 2020-10-16 DIAGNOSIS — E039 Hypothyroidism, unspecified: Secondary | ICD-10-CM | POA: Diagnosis not present

## 2020-10-16 DIAGNOSIS — E785 Hyperlipidemia, unspecified: Secondary | ICD-10-CM | POA: Diagnosis not present

## 2020-10-16 DIAGNOSIS — I10 Essential (primary) hypertension: Secondary | ICD-10-CM | POA: Diagnosis not present

## 2020-10-16 DIAGNOSIS — E1149 Type 2 diabetes mellitus with other diabetic neurological complication: Secondary | ICD-10-CM | POA: Diagnosis not present

## 2020-10-17 DIAGNOSIS — D225 Melanocytic nevi of trunk: Secondary | ICD-10-CM | POA: Diagnosis not present

## 2020-10-17 DIAGNOSIS — Z85828 Personal history of other malignant neoplasm of skin: Secondary | ICD-10-CM | POA: Diagnosis not present

## 2020-10-17 DIAGNOSIS — L820 Inflamed seborrheic keratosis: Secondary | ICD-10-CM | POA: Diagnosis not present

## 2020-10-17 DIAGNOSIS — L821 Other seborrheic keratosis: Secondary | ICD-10-CM | POA: Diagnosis not present

## 2020-10-17 DIAGNOSIS — L570 Actinic keratosis: Secondary | ICD-10-CM | POA: Diagnosis not present

## 2020-10-17 DIAGNOSIS — L720 Epidermal cyst: Secondary | ICD-10-CM | POA: Diagnosis not present

## 2020-10-17 DIAGNOSIS — L218 Other seborrheic dermatitis: Secondary | ICD-10-CM | POA: Diagnosis not present

## 2020-10-17 DIAGNOSIS — L814 Other melanin hyperpigmentation: Secondary | ICD-10-CM | POA: Diagnosis not present

## 2020-10-17 DIAGNOSIS — L905 Scar conditions and fibrosis of skin: Secondary | ICD-10-CM | POA: Diagnosis not present

## 2020-12-13 DIAGNOSIS — E785 Hyperlipidemia, unspecified: Secondary | ICD-10-CM | POA: Diagnosis not present

## 2020-12-13 DIAGNOSIS — E039 Hypothyroidism, unspecified: Secondary | ICD-10-CM | POA: Diagnosis not present

## 2020-12-13 DIAGNOSIS — I10 Essential (primary) hypertension: Secondary | ICD-10-CM | POA: Diagnosis not present

## 2020-12-13 DIAGNOSIS — E1149 Type 2 diabetes mellitus with other diabetic neurological complication: Secondary | ICD-10-CM | POA: Diagnosis not present

## 2020-12-13 DIAGNOSIS — Z794 Long term (current) use of insulin: Secondary | ICD-10-CM | POA: Diagnosis not present

## 2021-01-17 DIAGNOSIS — E669 Obesity, unspecified: Secondary | ICD-10-CM | POA: Diagnosis not present

## 2021-01-17 DIAGNOSIS — K921 Melena: Secondary | ICD-10-CM | POA: Diagnosis not present

## 2021-01-17 DIAGNOSIS — F319 Bipolar disorder, unspecified: Secondary | ICD-10-CM | POA: Diagnosis not present

## 2021-01-17 DIAGNOSIS — Z23 Encounter for immunization: Secondary | ICD-10-CM | POA: Diagnosis not present

## 2021-01-17 DIAGNOSIS — D696 Thrombocytopenia, unspecified: Secondary | ICD-10-CM | POA: Diagnosis not present

## 2021-01-17 DIAGNOSIS — E785 Hyperlipidemia, unspecified: Secondary | ICD-10-CM | POA: Diagnosis not present

## 2021-01-17 DIAGNOSIS — I10 Essential (primary) hypertension: Secondary | ICD-10-CM | POA: Diagnosis not present

## 2021-01-17 DIAGNOSIS — R7401 Elevation of levels of liver transaminase levels: Secondary | ICD-10-CM | POA: Diagnosis not present

## 2021-01-17 DIAGNOSIS — E1149 Type 2 diabetes mellitus with other diabetic neurological complication: Secondary | ICD-10-CM | POA: Diagnosis not present

## 2021-01-17 DIAGNOSIS — F17201 Nicotine dependence, unspecified, in remission: Secondary | ICD-10-CM | POA: Diagnosis not present

## 2021-01-17 DIAGNOSIS — F1020 Alcohol dependence, uncomplicated: Secondary | ICD-10-CM | POA: Diagnosis not present

## 2021-01-17 DIAGNOSIS — G629 Polyneuropathy, unspecified: Secondary | ICD-10-CM | POA: Diagnosis not present

## 2021-01-17 DIAGNOSIS — K760 Fatty (change of) liver, not elsewhere classified: Secondary | ICD-10-CM | POA: Diagnosis not present

## 2021-01-22 ENCOUNTER — Ambulatory Visit (INDEPENDENT_AMBULATORY_CARE_PROVIDER_SITE_OTHER): Payer: PPO | Admitting: Gastroenterology

## 2021-01-22 ENCOUNTER — Encounter: Payer: Self-pay | Admitting: Gastroenterology

## 2021-01-22 ENCOUNTER — Other Ambulatory Visit (INDEPENDENT_AMBULATORY_CARE_PROVIDER_SITE_OTHER): Payer: PPO

## 2021-01-22 VITALS — BP 144/72 | HR 67 | Ht 70.0 in | Wt 194.0 lb

## 2021-01-22 DIAGNOSIS — E119 Type 2 diabetes mellitus without complications: Secondary | ICD-10-CM | POA: Diagnosis not present

## 2021-01-22 DIAGNOSIS — Z1212 Encounter for screening for malignant neoplasm of rectum: Secondary | ICD-10-CM | POA: Diagnosis not present

## 2021-01-22 DIAGNOSIS — Z794 Long term (current) use of insulin: Secondary | ICD-10-CM | POA: Diagnosis not present

## 2021-01-22 DIAGNOSIS — R7989 Other specified abnormal findings of blood chemistry: Secondary | ICD-10-CM | POA: Diagnosis not present

## 2021-01-22 DIAGNOSIS — K625 Hemorrhage of anus and rectum: Secondary | ICD-10-CM

## 2021-01-22 DIAGNOSIS — K760 Fatty (change of) liver, not elsewhere classified: Secondary | ICD-10-CM | POA: Diagnosis not present

## 2021-01-22 LAB — PROTIME-INR
INR: 1.2 ratio — ABNORMAL HIGH (ref 0.8–1.0)
Prothrombin Time: 13.4 s — ABNORMAL HIGH (ref 9.6–13.1)

## 2021-01-22 LAB — IBC PANEL
Iron: 47 ug/dL (ref 42–165)
Saturation Ratios: 10.4 % — ABNORMAL LOW (ref 20.0–50.0)
TIBC: 452.2 ug/dL — ABNORMAL HIGH (ref 250.0–450.0)
Transferrin: 323 mg/dL (ref 212.0–360.0)

## 2021-01-22 LAB — FERRITIN: Ferritin: 53 ng/mL (ref 22.0–322.0)

## 2021-01-22 MED ORDER — CLENPIQ 10-3.5-12 MG-GM -GM/160ML PO SOLN: 1.0000 | ORAL | 0 refills | Status: DC

## 2021-01-22 NOTE — Patient Instructions (Addendum)
If you are age 52 or younger, your body mass index should be between 19-25. Your Body mass index is 27.84 kg/m. If this is out of the aformentioned range listed, please consider follow up with your Primary Care Provider.  __________________________________________________________  The [facility] providers would like to encourage you to use [facility] to communicate with providers for non-urgent requests or questions.  Due to long hold times on the telephone, sending your provider a message by [facility] [address] may be a faster and more efficient way to get a response.  Please allow 48 business hours for a response.  Please remember that this is for non-urgent requests.   You have been scheduled for a colonoscopy. Please follow written instructions given to you at your visit today.  Please pick up your prep supplies at the pharmacy within the next 1-3 days. If you use inhalers (even only as needed), please bring them with you on the day of your procedure.  Your provider has requested that you go to the basement level for lab work before leaving today. Press "B" on the elevator. The lab is located at the first door on the left as you exit the elevator.  You have been scheduled for an abdominal ultrasound at [facility] (1st floor of hospital) on 01/31/2021 at 9:00 am. Please arrive 15 minutes prior to your appointment for registration. Make certain not to have anything to eat or drink starting at midnight. Should you need to reschedule your appointment, please contact radiology at [phone]. This test typically takes about 30 minutes to perform.  Follow up pending the results of your Colonoscopy, Labs and Ultrasound.  Thank you for entrusting me with your care and choosing [facility].  Alonza Bogus, PA-C

## 2021-01-22 NOTE — Progress Notes (Signed)
01/22/2021 Donovyn Guidice [id number] 05-31-1970   HISTORY OF PRESENT ILLNESS: This is a 50 year old male who is new to our office.  He has been referred here by his PCP, Dr. Brigitte Pulse, in order to discuss colonoscopy and for evaluation of elevated LFTs/fatty liver.  Patient has never had a colonoscopy in the past.  He tells me that he does see some blood about 2-3 times a week either when wiping or sometimes in the toilet bowl.  He says that he moves his bowels regularly.  Denies any abdominal pain.  He has a history of heavy alcohol abuse.  He says that he quit drinking for few years, but has been back drinking about 8 ounces of wine a night for some time.  He had an ultrasound in 2017 that showed fatty liver.  He tells me that he had labs drawn by his PCP last week, but the last labs that I have were from March of this year.  At that time his total bilirubin was 1.7, AST 139, ALT 139, alk phos 116.  Albumin was normal at 4.4.  White blood cell count and hemoglobin were normal.  Platelets slightly low at 136.   Past Medical History:  Diagnosis Date   Anxiety    Diabetes mellitus without complication ([place])    off medication x 1 month   Heartburn    Hematuria    History of alcohol abuse    Hypertension    OCD (obsessive compulsive disorder)    Other symptoms involving urinary system(788.99)    Tourette disorder    Past Surgical History:  Procedure Laterality Date   MULTIPLE TOOTH EXTRACTIONS  2011    reports that he has been smoking cigars. He has never used smokeless tobacco. He reports that he does not drink alcohol and does not use drugs. family history includes Breast cancer in his mother; OCD in his mother; Skin cancer in his father. Allergies  Allergen Reactions   Penicillins     REACTION: Swelling of Neck      Outpatient Encounter Medications as of 01/22/2021  Medication Sig   ALPRAZolam (XANAX) 0.5 MG tablet Take 1 tablet (0.5 mg total) by mouth 3 (three) times daily as  needed for anxiety.   atorvastatin (LIPITOR) 40 MG tablet Take 40 mg by mouth daily.   insulin NPH-regular Human (70-30) 100 UNIT/ML injection Inject 20 Units into the skin 2 (two) times daily with a meal.   lisinopril (PRINIVIL,ZESTRIL) 10 MG tablet Take 10 mg by mouth daily.    metFORMIN (GLUCOPHAGE) 500 MG tablet Take 500 mg by mouth 2 (two) times daily.   OLANZapine (ZYPREXA) 20 MG tablet Take 1 tablet (20 mg total) by mouth at bedtime.   omeprazole (PRILOSEC OTC) 20 MG tablet Take 20 mg by mouth daily.   [DISCONTINUED] ASPIRIN 81 PO Take 325 tablets by mouth daily.  (Patient not taking: Reported on 07/23/2020)   [DISCONTINUED] hydrochlorothiazide (HYDRODIURIL) 25 MG tablet Take 25 mg by mouth daily. (Patient not taking: Reported on 07/23/2020)   [DISCONTINUED] VASCEPA 1 g CAPS Take 2 capsules by mouth 2 (two) times daily.   No facility-administered encounter medications on file as of 01/22/2021.     REVIEW OF SYSTEMS  : All other systems reviewed and negative except where noted in the History of Present Illness.   PHYSICAL EXAM: BP (!) 144/72   Pulse 67   Ht '5\' 10"'  (1.778 m)   Wt 194 lb (88 kg)   BMI  27.84 kg/m  General: Well developed white male in no acute distress Head: Normocephalic and atraumatic Eyes:  Sclerae anicteric, conjunctiva pink. Ears: Normal auditory acuity Lungs: Clear throughout to auscultation; no W/R/R. Heart: Regular rate and rhythm; no M/R/G. Abdomen: Soft, obese, non-distended.  BS present.  Fullness in epigastrium with some tenderness.  ? If this is his liver edge. Rectal:  Will be done at the time of colonoscopy. Musculoskeletal: Symmetrical with no gross deformities  Skin: No lesions on visible extremities Extremities: No edema  Neurological: Alert oriented x 4, grossly non-focal Psychological:  Alert and cooperative. Normal mood and affect  ASSESSMENT AND PLAN: *Elevated LFTs, fatty liver:  Had previous history of heavy ETOH abuse and stopped for a  few years but now is drinking about 8 ounces of wine ever night again for quite some time.  Likely all ETOH related.  Just had updated labs by PCP last week as the last that I have were from March.  Last ultrasound was 2017.  PCP wanted him to have a Fibroscan but he says that it was too expensive.  Agrees to a regular abdominal ultrasound.  Will plan for that as well as full liver serologic panel to rule out other concomitant causes of liver disease/elevated LFTs. Platelets had been running normal to just minimally low at 136.  Albumin is normal.     *Rectal bleeding:  Sees bright red blood on the toilet paper and sometimes in the bowl with bowel movements two or three times per week. *CRC screening:  Never had colonoscopy in the past.  Will schedule for colonoscopy with Dr. Silverio Decamp.  The risks, benefits, and alternatives to colonoscopy were discussed with the patient and they consent to proceed.  *IDDM:  Insulin will be adjusted prior to endoscopic procedure per protocol. Will resume normal dosing after procedure.    CC:  Ginger Organ., MD

## 2021-01-24 ENCOUNTER — Ambulatory Visit (INDEPENDENT_AMBULATORY_CARE_PROVIDER_SITE_OTHER): Payer: PPO | Admitting: Psychiatry

## 2021-01-24 ENCOUNTER — Encounter: Payer: Self-pay | Admitting: Psychiatry

## 2021-01-24 ENCOUNTER — Other Ambulatory Visit: Payer: Self-pay

## 2021-01-24 DIAGNOSIS — F250 Schizoaffective disorder, bipolar type: Secondary | ICD-10-CM

## 2021-01-24 DIAGNOSIS — F4010 Social phobia, unspecified: Secondary | ICD-10-CM

## 2021-01-24 DIAGNOSIS — F952 Tourette's disorder: Secondary | ICD-10-CM | POA: Diagnosis not present

## 2021-01-24 DIAGNOSIS — F4001 Agoraphobia with panic disorder: Secondary | ICD-10-CM

## 2021-01-24 LAB — ANA: Anti Nuclear Antibody (ANA): NEGATIVE

## 2021-01-24 LAB — IGG: IgG (Immunoglobin G), Serum: 1360 mg/dL (ref 600–1640)

## 2021-01-24 LAB — IGA: Immunoglobulin A: 345 mg/dL — ABNORMAL HIGH (ref 47–310)

## 2021-01-24 LAB — ANTI-SMOOTH MUSCLE ANTIBODY, IGG: Actin (Smooth Muscle) Antibody (IGG): <20 U (ref <20)

## 2021-01-24 LAB — HEPATITIS B SURFACE ANTIBODY,QUALITATIVE: Hep B S Ab: NONREACTIVE

## 2021-01-24 LAB — HEPATITIS B SURFACE ANTIGEN: Hepatitis B Surface Ag: NONREACTIVE

## 2021-01-24 LAB — MITOCHONDRIAL ANTIBODIES: Mitochondrial M2 Ab, IgG: <=20 U (ref <=20.0)

## 2021-01-24 LAB — TISSUE TRANSGLUTAMINASE, IGA: (tTG) Ab, IgA: <1 U/mL

## 2021-01-24 LAB — ALPHA-1-ANTITRYPSIN: A-1 Antitrypsin, Ser: 240 mg/dL — ABNORMAL HIGH (ref 83–199)

## 2021-01-24 LAB — HEPATITIS A ANTIBODY, TOTAL: Hepatitis A AB,Total: NONREACTIVE

## 2021-01-24 LAB — HEPATITIS C ANTIBODY
Hepatitis C Ab: NONREACTIVE
SIGNAL TO CUT-OFF: 0.02 (ref <1.00)

## 2021-01-24 LAB — CERULOPLASMIN: Ceruloplasmin: 30 mg/dL (ref 18–36)

## 2021-01-24 MED ORDER — ALPRAZOLAM 0.5 MG PO TABS: 0.5000 mg | ORAL_TABLET | Freq: Three times a day (TID) | ORAL | 2 refills | Status: DC | PRN

## 2021-01-24 MED ORDER — OLANZAPINE 20 MG PO TABS: 20.0000 mg | ORAL_TABLET | Freq: Every day | ORAL | 1 refills | Status: DC

## 2021-01-24 NOTE — Progress Notes (Signed)
Miguel Guerra [id number] [birth year] 50 y.o.  Subjective:   Patient ID:  Miguel Guerra is a 50 y.o. (DOB 10-02-1970) male.  Chief Complaint:  Chief Complaint  Patient presents with   Schizoaffective disorder   Follow-up   Hallucinations    Anxiety Symptoms include nervous/anxious behavior. Patient reports no confusion, decreased concentration, palpitations or suicidal ideas.    Miguel Guerra presents to the office today for follow-up of  tourette's disorder and psychosis.  02/2019 appt with the following noted: consistent with meds.  Status quo with sx as noted below.   Orap helps with tics but doesn't eliminate them.  It's awkward socially some but he doesn't have friends or go out in public much anyway bc of Covid.  I notice it all the time but has had facial and head tics since childhood.  Neurologist originally Rx the Orap and then PCP took it over.   Off and on visual hallucinations of people, bugs and animals and sometimes scary.  Still gets depressed but at baseline.  Satisfied with meds. Labs with PCP. Hyponatremia dt excessive water.  Stopped beer.  Not abusing alcohol.  Quit smoking cigarettes after smoking 2ppd for 16 years.  Already feels better. Overall doing ok with good days and bad days but stable compared to before. Sleep pretty good with meds.  Still sees shower people in the shower, 5 different people.  Can't explain that one.  Also sees images of people in other locations too.  Can see people walk in his house.  They are not always clear.  Not hearing voices for awhile.  Last AH a couple of mos ago.  May hear voices that wake him from sleep.  Sleeping better off cigarettes.  At first it scared him, but has gottten more used to it.   Occ feels watched.  No recent voices and hallucinations no longer waking him.  Not lasting depression.  Does have anxiety including social and panic.  Panic attacks like a heart attack and are scary.  Has had to leave store DT panic before.   Tries to avoid people.  Does feel paranoid around people, feels they watch him.  Usually still going to gym now and enjoys the progress he sees. Quit drinking.  Some weight gain. Quit smoking and caffeine. Only taking 0.5 mg Xanax daily. Not abusing.  Can still have panic in public. Pending MRI head bc slurred speech and can't write anymore and hard to read.  ? Ministroke.  Had speech therapy as akid. Plan: No medication changes indicated except stop risperidone bc using Orap for tics now.    01/25/20 appt with the following noted: Had 2 weeks straight of seeing a person in front of him, usually a man, but then it went away.  Happened at home and parents' home.  It did not talk.  Frightens him when it happens.  You'd think I'd be used to it.  occ hears voices but overall feels psychotic sx are manageable. Stopped Orap bc of money. Sleeps well with olanzapine.   No beer in 8 mos.  Occ glass of wine at dinner.  Lost 20# off alcohol.  Also on a diet.  Never drank liquor.   Takes Xanax every AM for anxiety.   Tics involve oral and facial and neck movements and masks help hide this so is ok off Orap which he couldn't afford. He may want to try meds later. Plan: Continue olanzapine 20 mg HS He's not sure but thinks he stopped  the lamotrigine.   07/23/2020 appointment with the following noted: Hanging in there.  About the same.  Still see things in the house but handles it.  Day or night can feel something is looking at me.  Turns around and sees it briefly and it dissipates.  Not talking to him.  Nothing too scary.   Anxiety managed with Xanax AM and rarely thereafter.   Occ depression but not all the time. No problems with olanzapine which helps him sleep.  Taking meds for DM.  Had DM for 6-7 years he thinks. Plan: Continue olanzapine 20 mg HS  And alprazolam.    01/24/21 appt noted: Good and bad days.  Excellent until 2-3 weeks ago started seeing people in the house staring at him after waking  from sleep and once awake a while then they go away.  But does scare him.  3-4 mos ago nothing was happenining.  Sleep unchanged and good. Takes about Xanax 0.5 mg per day/  people can make him anxious.  It does help but not abused. Stress money but mother helps him out.  Her health is good.  F NHP for dementia. No SE meds.  Depression managed. Tics come and go and doesn't like them but can't afford Orap.  Might get from [place].  Patient on disability  Past Psychiatric Medication Trials: risperidone, Orap $, lamotrigine   Review of Systems:  Review of Systems  Cardiovascular:  Negative for palpitations.  Gastrointestinal:  Negative for abdominal distention and diarrhea.  Neurological:  Negative for tremors and weakness.  Psychiatric/Behavioral:  Positive for hallucinations. Negative for agitation, behavioral problems, confusion, decreased concentration, dysphoric mood, self-injury, sleep disturbance and suicidal ideas. The patient is nervous/anxious. The patient is not hyperactive.    Medications: I have reviewed the patient's current medications.  Current Outpatient Medications  Medication Sig Dispense Refill   atorvastatin (LIPITOR) 40 MG tablet Take 40 mg by mouth daily.     insulin NPH-regular Human (70-30) 100 UNIT/ML injection Inject 20 Units into the skin 2 (two) times daily with a meal.     lisinopril (PRINIVIL,ZESTRIL) 10 MG tablet Take 10 mg by mouth daily.      metFORMIN (GLUCOPHAGE) 500 MG tablet Take 500 mg by mouth 2 (two) times daily.     omeprazole (PRILOSEC OTC) 20 MG tablet Take 20 mg by mouth daily.     ALPRAZolam (XANAX) 0.5 MG tablet Take 1 tablet (0.5 mg total) by mouth 3 (three) times daily as needed for anxiety. 60 tablet 2   OLANZapine (ZYPREXA) 20 MG tablet Take 1 tablet (20 mg total) by mouth at bedtime. 90 tablet 1   Sod Picosulfate-Mag Ox-Cit Acd (CLENPIQ) 10-3.5-12 MG-GM -GM/160ML SOLN Take 1 kit by mouth as directed. (Patient not taking: Reported on  01/24/2021) 320 mL 0   No current facility-administered medications for this visit.    Medication Side Effects: Other: dry mouth  Allergies:  Allergies  Allergen Reactions   Penicillins     REACTION: Swelling of Neck    Past Medical History:  Diagnosis Date   Anxiety    Diabetes mellitus without complication ([facility])    off medication x 1 month   Heartburn    Hematuria    History of alcohol abuse    Hypertension    OCD (obsessive compulsive disorder)    Other symptoms involving urinary system(788.99)    Tourette disorder     Family History  Problem Relation Age of Onset   Breast cancer Mother  OCD Mother    Skin cancer Father     Social History   Socioeconomic History   Marital status: Single    Spouse name: Not on file   Number of children: 0   Years of education: college   Highest education level: Not on file  Occupational History   Occupation: umemployed  Tobacco Use   Smoking status: Some Days    Types: Cigars   Smokeless tobacco: Never  Substance and Sexual Activity   Alcohol use: No    Alcohol/week: 72.0 standard drinks    Types: 70 Cans of beer, 2 Shots of liquor per week    Comment: quit 12/2012   Drug use: No   Sexual activity: Not on file  Other Topics Concern   Not on file  Social History Narrative   Not on file   Social Determinants of Health   Financial Resource Strain: Not on file  Food Insecurity: Not on file  Transportation Needs: Not on file  Physical Activity: Not on file  Stress: Not on file  Social Connections: Not on file  Intimate Partner Violence: Not on file    Past Medical History, Surgical history, Social history, and Family history were reviewed and updated as appropriate.   Please see review of systems for further details on the patient's review from today.   Objective:   Physical Exam:  There were no vitals taken for this visit.  Physical Exam Constitutional:      General: He is not in acute  distress. Musculoskeletal:        General: No deformity.  Neurological:     Mental Status: He is alert and oriented to person, place, and time.     Cranial Nerves: No dysarthria.     Coordination: Coordination normal.  Psychiatric:        Attention and Perception: Attention normal. He perceives visual hallucinations. He does not perceive auditory hallucinations.        Mood and Affect: Mood is not anxious or depressed. Affect is not labile, blunt, angry or inappropriate.        Speech: Speech normal.        Behavior: Behavior normal. Behavior is cooperative.        Thought Content: Thought content is not paranoid or delusional. Thought content does not include homicidal or suicidal ideation. Thought content does not include homicidal or suicidal plan.        Cognition and Memory: Cognition and memory normal.        Judgment: Judgment normal.     Comments: Insight fair.  Hallucinations are at baseline.    Lab Review:     Component Value Date/Time   NA 137 02/22/2014 0834   K 4.4 02/22/2014 0834   CL 94 (L) 02/22/2014 0834   CO2 24 02/22/2014 0834   GLUCOSE 174 (H) 02/22/2014 0834   BUN 14 02/22/2014 0834   CREATININE 1.12 02/22/2014 0834   CALCIUM 10.2 02/22/2014 0834   PROT 7.1 02/22/2014 0834   ALBUMIN 4.7 02/22/2014 0834   AST 16 02/22/2014 0834   ALT 21 02/22/2014 0834   ALKPHOS 60 02/22/2014 0834   BILITOT 0.2 02/22/2014 0834   GFRNONAA 79 02/22/2014 0834   GFRAA 92 02/22/2014 0834    No results found for: WBC, RBC, HGB, HCT, PLT, MCV, MCH, MCHC, RDW, LYMPHSABS, MONOABS, EOSABS, BASOSABS  No results found for: POCLITH, LITHIUM   Lab Results  Component Value Date   VALPROATE 55 02/22/2014     .  res Assessment: Plan:    Schizoaffective disorder, bipolar type ([place]) - Plan: OLANZapine (ZYPREXA) 20 MG tablet  Panic disorder with agoraphobia - Plan: ALPRAZolam (XANAX) 0.5 MG tablet  Social anxiety disorder  Tourette's disorder   Greater than 50% of 30-minute  phone to phone with patient was spent on counseling and coordination of care. We discussed his diagnoses above and chronic psychosis with anxiety. Chronic visual hallucinations scary at night but no worse and limited AH at this time.  Overall he appears to be at baseline.  Regarding his chronic auditory hallucinations they are improved but he has still has chronic visual hallucinations of an unusual sort.  They are not improving with higher doses of antipsychotic.  I am reluctant to go higher as I do not expect we are likely to see resolution.  We could consider clozapine but it is a much more difficult medicine to use requiring the weekly CBC due to the risk of aplastic anemia.  He is aware of that option but wishes to defer at this time.  Discussed potential metabolic side effects associated with atypical antipsychotics, as well as potential risk for movement side effects. Advised pt to contact office if movement side effects occur.  Discussed the risk of using multiple antipsychotics but he does appear to be tolerating them well at this time.  The Zyprexa to help with hallucinations  Continue olanzapine 20 mg HS  And alprazolam.  No abuse apparent.  No med changes indicated  We discussed the short-term risks associated with benzodiazepines including sedation and increased fall risk among others.  Discussed long-term side effect risk including dependence, potential withdrawal symptoms, and the potential eventual dose-related risk of dementia.  But recent studies from 2020 dispute this association between benzodiazepines and dementia risk. Newer studies in 2020 do not support an association with dementia.  Tics are tolerable off the risperidone and Orap. He feels it's manageable at present.  He may see another neuro bc can't afford Orap.  We discussed discussed his social anxiety and panic in public.  I am reluctant to add more medications at this particular time but we may consider this in the  future.   Follow-up 6 months  Lynder Parents MD, DFAPA    Please see After Visit Summary for patient specific instructions.  Future Appointments  Date Time Provider [place]  01/31/2021  9:00 AM [facility] [place]  02/04/2021  4:00 PM Nandigam, Venia Minks, MD [facility]    No orders of the defined types were placed in this encounter.     -------------------------------

## 2021-01-28 ENCOUNTER — Telehealth: Payer: Self-pay | Admitting: Gastroenterology

## 2021-01-28 NOTE — Telephone Encounter (Signed)
Hi Dr. Silverio Decamp, this patient just called to cancel procedure that was scheduled on 02/04/21 because of issues with his ride. Patient has rescheduled to 03/08/21. Thank you.

## 2021-01-28 NOTE — Telephone Encounter (Signed)
ok 

## 2021-01-30 NOTE — Progress Notes (Signed)
Reviewed and agree with documentation and assessment and plan. K. Veena Hartley Wyke , MD   

## 2021-01-31 ENCOUNTER — Ambulatory Visit (HOSPITAL_COMMUNITY): Payer: PPO

## 2021-02-04 ENCOUNTER — Encounter: Payer: PPO | Admitting: Gastroenterology

## 2021-02-12 DIAGNOSIS — H9201 Otalgia, right ear: Secondary | ICD-10-CM | POA: Diagnosis not present

## 2021-02-12 DIAGNOSIS — H6121 Impacted cerumen, right ear: Secondary | ICD-10-CM | POA: Diagnosis not present

## 2021-02-28 ENCOUNTER — Telehealth: Payer: Self-pay | Admitting: Gastroenterology

## 2021-02-28 NOTE — Telephone Encounter (Signed)
Patient's mother called regarding procedure 03/07/21.  Can you please send her updated instructions for the colon prep to reflect the correct dates and times and when she has to do what to her son's My Chart?  She will have to help him with his prep and would like exact times she's supposed to do things.  Thank you for your help.

## 2021-03-01 NOTE — Telephone Encounter (Signed)
Printed new instructions today she will look at them through My Chart   Tried to contact pts mother no answer, no voice mail      New instructions done

## 2021-03-08 ENCOUNTER — Ambulatory Visit (AMBULATORY_SURGERY_CENTER): Payer: PPO | Admitting: Gastroenterology

## 2021-03-08 ENCOUNTER — Encounter: Payer: Self-pay | Admitting: Gastroenterology

## 2021-03-08 ENCOUNTER — Other Ambulatory Visit: Payer: Self-pay

## 2021-03-08 VITALS — BP 121/64 | HR 74 | Temp 98.0°F | Resp 16 | Ht 70.0 in | Wt 194.0 lb

## 2021-03-08 DIAGNOSIS — K621 Rectal polyp: Secondary | ICD-10-CM

## 2021-03-08 DIAGNOSIS — F419 Anxiety disorder, unspecified: Secondary | ICD-10-CM | POA: Diagnosis not present

## 2021-03-08 DIAGNOSIS — D128 Benign neoplasm of rectum: Secondary | ICD-10-CM

## 2021-03-08 DIAGNOSIS — K5730 Diverticulosis of large intestine without perforation or abscess without bleeding: Secondary | ICD-10-CM | POA: Diagnosis not present

## 2021-03-08 DIAGNOSIS — K319 Disease of stomach and duodenum, unspecified: Secondary | ICD-10-CM

## 2021-03-08 DIAGNOSIS — K2990 Gastroduodenitis, unspecified, without bleeding: Secondary | ICD-10-CM

## 2021-03-08 DIAGNOSIS — K7030 Alcoholic cirrhosis of liver without ascites: Secondary | ICD-10-CM | POA: Diagnosis not present

## 2021-03-08 DIAGNOSIS — K317 Polyp of stomach and duodenum: Secondary | ICD-10-CM | POA: Diagnosis not present

## 2021-03-08 DIAGNOSIS — I8510 Secondary esophageal varices without bleeding: Secondary | ICD-10-CM | POA: Diagnosis not present

## 2021-03-08 DIAGNOSIS — K766 Portal hypertension: Secondary | ICD-10-CM | POA: Diagnosis not present

## 2021-03-08 DIAGNOSIS — I10 Essential (primary) hypertension: Secondary | ICD-10-CM | POA: Diagnosis not present

## 2021-03-08 DIAGNOSIS — K648 Other hemorrhoids: Secondary | ICD-10-CM | POA: Diagnosis not present

## 2021-03-08 DIAGNOSIS — K625 Hemorrhage of anus and rectum: Secondary | ICD-10-CM

## 2021-03-08 DIAGNOSIS — K2970 Gastritis, unspecified, without bleeding: Secondary | ICD-10-CM | POA: Diagnosis not present

## 2021-03-08 DIAGNOSIS — E119 Type 2 diabetes mellitus without complications: Secondary | ICD-10-CM | POA: Diagnosis not present

## 2021-03-08 MED ORDER — SODIUM CHLORIDE 0.9 % IV SOLN: 500.0000 mL | Freq: Once | INTRAVENOUS | Status: DC

## 2021-03-08 MED ORDER — NADOLOL 20 MG PO TABS: 20.0000 mg | ORAL_TABLET | Freq: Every day | ORAL | 11 refills | Status: DC

## 2021-03-08 MED ORDER — OMEPRAZOLE 40 MG PO CPDR: 40.0000 mg | DELAYED_RELEASE_CAPSULE | Freq: Every day | ORAL | 3 refills | Status: DC

## 2021-03-08 NOTE — Progress Notes (Signed)
Sedate, gd SR, tolerated procedure well, VSS, report to RN 

## 2021-03-08 NOTE — Patient Instructions (Signed)
YOU HAD AN ENDOSCOPIC PROCEDURE TODAY AT [facility]:   Refer to the procedure report that was given to you for any specific questions about what was found during the examination.  If the procedure report does not answer your questions, please call your gastroenterologist to clarify.  If you requested that your care partner not be given the details of your procedure findings, then the procedure report has been included in a sealed envelope for you to review at your convenience later.  **Handouts given on polyps and clip card**  YOU SHOULD EXPECT: Some feelings of bloating in the abdomen. Passage of more gas than usual.  Walking can help get rid of the air that was put into your GI tract during the procedure and reduce the bloating. If you had a lower endoscopy (such as a colonoscopy or flexible sigmoidoscopy) you may notice spotting of blood in your stool or on the toilet paper. If you underwent a bowel prep for your procedure, you may not have a normal bowel movement for a few days.  Please Note:  You might notice some irritation and congestion in your nose or some drainage.  This is from the oxygen used during your procedure.  There is no need for concern and it should clear up in a day or so.  SYMPTOMS TO REPORT IMMEDIATELY:  Following lower endoscopy (colonoscopy or flexible sigmoidoscopy):  Excessive amounts of blood in the stool  Significant tenderness or worsening of abdominal pains  Swelling of the abdomen that is new, acute  Fever of 100F or higher  Following upper endoscopy (EGD)  Vomiting of blood or coffee ground material  New chest pain or pain under the shoulder blades  Painful or persistently difficult swallowing  New shortness of breath  Fever of 100F or higher  Black, tarry-looking stools  For urgent or emergent issues, a gastroenterologist can be reached at any hour by calling [phone]. Do not use MyChart messaging for urgent concerns.    DIET:   We do recommend a small meal at first, but then you may proceed to your regular diet.  Drink plenty of fluids but you should avoid alcoholic beverages for 24 hours.  ACTIVITY:  You should plan to take it easy for the rest of today and you should NOT DRIVE or use heavy machinery until tomorrow (because of the sedation medicines used during the test).    FOLLOW UP: Our staff will call the number listed on your records 48-72 hours following your procedure to check on you and address any questions or concerns that you may have regarding the information given to you following your procedure. If we do not reach you, we will leave a message.  We will attempt to reach you two times.  During this call, we will ask if you have developed any symptoms of COVID 19. If you develop any symptoms (ie: fever, flu-like symptoms, shortness of breath, cough etc.) before then, please call [phone].  If you test positive for Covid 19 in the 2 weeks post procedure, please call and report this information to [place].    If any biopsies were taken you will be contacted by phone or by letter within the next 1-3 weeks.  Please call us at [phone] if you have not heard about the biopsies in 3 weeks.    SIGNATURES/CONFIDENTIALITY: You and/or your care partner have signed paperwork which will be entered into your electronic medical record.  These signatures attest to the fact  that that the information above on your After Visit Summary has been reviewed and is understood.  Full responsibility of the confidentiality of this discharge information lies with you and/or your care-partner.

## 2021-03-08 NOTE — Progress Notes (Signed)
Called to room to assist during endoscopic procedure.  Patient ID and intended procedure confirmed with present staff. Received instructions for my participation in the procedure from the performing physician.  

## 2021-03-08 NOTE — Progress Notes (Signed)
[facility] History and Physical   Primary Care Physician:  Ginger Organ., MD   Reason for Procedure:  Rectal bleeding, fatty liver to exclude esophageal varices and portal hypertension  Plan:    EGD and colonoscopy with possible interventions as needed     HPI: Miguel Guerra is a very pleasant 50 y.o. malehere for EGD and colonoscopy. He has abnormal LFT, fatty liver and thrombocytopenia.  Plan for EGD to screen for esophageal varices and suspected portal hypertension.  History of heavy EtOH use Intermittent rectal bleeding, will need to exclude neoplastic lesion.  Plan to proceed with colonoscopy   The risks and benefits as well as alternatives of endoscopic procedure(s) have been discussed and reviewed. All questions answered. The patient agrees to proceed.    Past Medical History:  Diagnosis Date   Anxiety    Diabetes mellitus without complication ([place])    off medication x 1 month   Heartburn    Hematuria    History of alcohol abuse    Hypertension    OCD (obsessive compulsive disorder)    Other symptoms involving urinary system(788.99)    Tourette disorder     Past Surgical History:  Procedure Laterality Date   MULTIPLE TOOTH EXTRACTIONS  2011    Prior to Admission medications   Medication Sig Start Date End Date Taking? Authorizing Provider  ALPRAZolam Duanne Moron) 0.5 MG tablet Take 1 tablet (0.5 mg total) by mouth 3 (three) times daily as needed for anxiety. 01/24/21   Cottle, Billey Co., MD  atorvastatin (LIPITOR) 40 MG tablet Take 40 mg by mouth daily.    [provider]  insulin NPH-regular Human (70-30) 100 UNIT/ML injection Inject 20 Units into the skin 2 (two) times daily with a meal.    [provider]  lisinopril (PRINIVIL,ZESTRIL) 10 MG tablet Take 10 mg by mouth daily.     [provider]  metFORMIN (GLUCOPHAGE) 500 MG tablet Take 500 mg by mouth 2 (two) times daily. 12/30/19   [provider]   OLANZapine (ZYPREXA) 20 MG tablet Take 1 tablet (20 mg total) by mouth at bedtime. 01/24/21   Cottle, Billey Co., MD  omeprazole (PRILOSEC OTC) 20 MG tablet Take 20 mg by mouth daily.    [provider]  Sod Picosulfate-Mag Ox-Cit Acd (CLENPIQ) 10-3.5-12 MG-GM -GM/160ML SOLN Take 1 kit by mouth as directed. Patient not taking: Reported on 01/24/2021 01/22/21   Zehr, Laban Emperor, PA-C    Current Outpatient Medications  Medication Sig Dispense Refill   ALPRAZolam (XANAX) 0.5 MG tablet Take 1 tablet (0.5 mg total) by mouth 3 (three) times daily as needed for anxiety. 60 tablet 2   atorvastatin (LIPITOR) 40 MG tablet Take 40 mg by mouth daily.     insulin NPH-regular Human (70-30) 100 UNIT/ML injection Inject 20 Units into the skin 2 (two) times daily with a meal.     lisinopril (PRINIVIL,ZESTRIL) 10 MG tablet Take 10 mg by mouth daily.      metFORMIN (GLUCOPHAGE) 500 MG tablet Take 500 mg by mouth 2 (two) times daily.     OLANZapine (ZYPREXA) 20 MG tablet Take 1 tablet (20 mg total) by mouth at bedtime. 90 tablet 1   omeprazole (PRILOSEC OTC) 20 MG tablet Take 20 mg by mouth daily.     Sod Picosulfate-Mag Ox-Cit Acd (CLENPIQ) 10-3.5-12 MG-GM -GM/160ML SOLN Take 1 kit by mouth as directed. (Patient not taking: Reported on 01/24/2021) 320 mL 0   No current  facility-administered medications for this visit.    Allergies as of 03/08/2021 - Review Complete 01/24/2021  Allergen Reaction Noted   Penicillins      Family History  Problem Relation Age of Onset   Breast cancer Mother    OCD Mother    Skin cancer Father     Social History   Socioeconomic History   Marital status: Single    Spouse name: Not on file   Number of children: 0   Years of education: college   Highest education level: Not on file  Occupational History   Occupation: umemployed  Tobacco Use   Smoking status: Some Days    Types: Cigars   Smokeless tobacco: Never  Substance and Sexual Activity   Alcohol use:  No    Alcohol/week: 72.0 standard drinks    Types: 70 Cans of beer, 2 Shots of liquor per week    Comment: quit 12/2012   Drug use: No   Sexual activity: Not on file  Other Topics Concern   Not on file  Social History Narrative   Not on file   Social Determinants of Health   Financial Resource Strain: Not on file  Food Insecurity: Not on file  Transportation Needs: Not on file  Physical Activity: Not on file  Stress: Not on file  Social Connections: Not on file  Intimate Partner Violence: Not on file    Review of Systems:  All other review of systems negative except as mentioned in the HPI.  Physical Exam: Vital signs in last 24 hours: BP 133/66 (BP Location: Right Arm, Patient Position: Sitting, Cuff Size: Normal)   Pulse 75   Temp 98 F (36.7 C) (Temporal)   Ht '5\' 10"'  (1.778 m)   Wt 194 lb (88 kg)   SpO2 97%   BMI 27.84 kg/m     General:   Alert, NAD Lungs:  Clear .   Heart:  Regular rate and rhythm Abdomen:  Soft, nontender and nondistended. Neuro/Psych:  Alert and cooperative. Normal mood and affect. A and O x 3  Reviewed labs, radiology imaging, old records and pertinent past GI work up  Patient is appropriate for planned procedure(s) and anesthesia in an ambulatory setting   K. Denzil Magnuson , MD [phone]

## 2021-03-08 NOTE — Progress Notes (Signed)
History reviewed 

## 2021-03-08 NOTE — Op Note (Signed)
[facility] Patient Name: Miguel Guerra Procedure Date: 03/08/2021 1:30 PM MRN: [id number] Endoscopist: Mauri Pole , MD Age: 50 Referring MD:  Date of Birth: July 19, 1970 Gender: Male Account #: [id number] Procedure:                Upper GI endoscopy Indications:              To evaluate esophageal varices in patient with                            suspected portal hypertension. ETOH cirrhosis Medicines:                Monitored Anesthesia Care Procedure:                Pre-Anesthesia Assessment:                           - Prior to the procedure, a History and Physical                            was performed, and patient medications and                            allergies were reviewed. The patient's tolerance of                            previous anesthesia was also reviewed. The risks                            and benefits of the procedure and the sedation                            options and risks were discussed with the patient.                            All questions were answered, and informed consent                            was obtained. Prior Anticoagulants: The patient has                            taken no previous anticoagulant or antiplatelet                            agents. ASA Grade Assessment: III - A patient with                            severe systemic disease. After reviewing the risks                            and benefits, the patient was deemed in                            satisfactory condition to undergo the procedure.  After obtaining informed consent, the endoscope was                            passed under direct vision. Throughout the                            procedure, the patient's blood pressure, pulse, and                            oxygen saturations were monitored continuously. The                            GIF [id number] was introduced through the                            mouth,  and advanced to the second part of duodenum.                            The upper GI endoscopy was accomplished without                            difficulty. The patient tolerated the procedure                            well. Scope In: Scope Out: Findings:                 Grade II varices were found in the middle third of                            the esophagus and in the lower third of the                            esophagus. They were less than 5 mm in largest                            diameter.                           The Z-line was regular and was found 36 cm from the                            incisors.                           Moderate portal hypertensive gastropathy was found                            in the entire examined stomach.                           Patchy moderately friable mucosa with contact                            bleeding was found in the entire examined stomach.  Biopsies were taken with a cold forceps for                            histology.                           The examined duodenum was normal. Complications:            No immediate complications. Estimated Blood Loss:     Estimated blood loss was minimal. Impression:               - Grade II esophageal varices.                           - Z-line regular, 36 cm from the incisors.                           - Portal hypertensive gastropathy.                           - Friable gastric mucosa. Biopsied.                           - Normal examined duodenum. Recommendation:           - Resume previous diet.                           - Continue present medications.                           - Await pathology results.                           - Increase to Omeprazole 40mg  daily. Rx for 90 days                            with 3 refills                           - Start Nadolol 20mg  daily for esophageal varices                            bleeding prophylaxis. Rx for 30 days  with 11 refills                           - Alcohol cessation                           - Follow up in office visit in 3-4 weeks, please                            schedule with APP or MD based on availability Mauri Pole, MD 03/08/2021 2:10:58 PM This report has been signed electronically.

## 2021-03-08 NOTE — Op Note (Signed)
[place] Patient Name: Miguel Guerra Procedure Date: 03/08/2021 1:29 PM MRN: [id number] Endoscopist: Mauri Pole , MD Age: 50 Referring MD:  Date of Birth: [birth year] Gender: Male Account #: [id number] Procedure:                Colonoscopy Indications:              Evaluation of unexplained GI bleeding presenting                            with Hematochezia Medicines:                Monitored Anesthesia Care Procedure:                Pre-Anesthesia Assessment:                           - Prior to the procedure, a History and Physical                            was performed, and patient medications and                            allergies were reviewed. The patient's tolerance of                            previous anesthesia was also reviewed. The risks                            and benefits of the procedure and the sedation                            options and risks were discussed with the patient.                            All questions were answered, and informed consent                            was obtained. Prior Anticoagulants: The patient has                            taken no previous anticoagulant or antiplatelet                            agents. ASA Grade Assessment: III - A patient with                            severe systemic disease. After reviewing the risks                            and benefits, the patient was deemed in                            satisfactory condition to undergo the procedure.  After obtaining informed consent, the colonoscope                            was passed under direct vision. Throughout the                            procedure, the patient's blood pressure, pulse, and                            oxygen saturations were monitored continuously. The                            [id number]-HQ190L Colonoscope was introduced through the                            anus and advanced to the the cecum,  identified by                            appendiceal orifice and ileocecal valve. The                            colonoscopy was performed without difficulty. The                            patient tolerated the procedure well. The quality                            of the bowel preparation was adequate. The                            ileocecal valve, appendiceal orifice, and rectum                            were photographed. Scope In: 1:49:40 PM Scope Out: 2:04:00 PM Scope Withdrawal Time: 0 hours 11 minutes 26 seconds  Total Procedure Duration: 0 hours 14 minutes 20 seconds  Findings:                 The perianal and digital rectal examinations were                            normal.                           A 22 mm polyp was found in the rectum. The polyp                            was multi-lobulated, ulcerated, bleeding and                            semi-pedunculated. The polyp was removed with a hot                            snare. Resection and retrieval were complete. To  prevent bleeding after the polypectomy, one                            hemostatic clip was successfully placed (MR                            conditional). There was no bleeding at the end of                            the procedure.                           Scattered small-mouthed diverticula were found in                            the sigmoid colon, descending colon and ascending                            colon.                           Non-bleeding external and internal hemorrhoids were                            found during retroflexion. The hemorrhoids were                            medium-sized. Complications:            No immediate complications. Estimated Blood Loss:     Estimated blood loss was minimal. Impression:               - One 22 mm polyp in the rectum, removed with a hot                            snare. Resected and retrieved. Clip (MR                             conditional) was placed.                           - Diverticulosis in the sigmoid colon, in the                            descending colon and in the ascending colon.                           - Non-bleeding external and internal hemorrhoids. Recommendation:           - Patient has a contact number available for                            emergencies. The signs and symptoms of potential                            delayed complications were discussed with the  patient. Return to normal activities tomorrow.                            Written discharge instructions were provided to the                            patient.                           - Resume previous diet.                           - Continue present medications.                           - Await pathology results.                           - Repeat colonoscopy date to be determined after                            pending pathology results are reviewed for                            surveillance based on pathology results. Mauri Pole, MD 03/08/2021 2:14:16 PM This report has been signed electronically.

## 2021-03-12 ENCOUNTER — Telehealth: Payer: Self-pay

## 2021-03-12 NOTE — Telephone Encounter (Signed)
  Follow up Call-  Call back number 03/08/2021  Post procedure Call Back phone  # [phone]  Permission to leave phone message Yes  Some recent data might be hidden     Patient questions:  Do you have a fever, pain , or abdominal swelling? No. Pain Score  0 *  Have you tolerated food without any problems? Yes.    Have you been able to return to your normal activities? Yes.    Do you have any questions about your discharge instructions: Diet   No. Medications  No. Follow up visit  No.  Do you have questions or concerns about your Care? No.  Actions: * If pain score is 4 or above: No action needed, pain <4.

## 2021-03-12 NOTE — Telephone Encounter (Signed)
Left message on follow up call. 

## 2021-03-14 DIAGNOSIS — E785 Hyperlipidemia, unspecified: Secondary | ICD-10-CM | POA: Diagnosis not present

## 2021-03-14 DIAGNOSIS — E1149 Type 2 diabetes mellitus with other diabetic neurological complication: Secondary | ICD-10-CM | POA: Diagnosis not present

## 2021-03-14 DIAGNOSIS — Z794 Long term (current) use of insulin: Secondary | ICD-10-CM | POA: Diagnosis not present

## 2021-03-14 DIAGNOSIS — I10 Essential (primary) hypertension: Secondary | ICD-10-CM | POA: Diagnosis not present

## 2021-04-03 ENCOUNTER — Encounter: Payer: Self-pay | Admitting: Physician Assistant

## 2021-04-03 ENCOUNTER — Other Ambulatory Visit (INDEPENDENT_AMBULATORY_CARE_PROVIDER_SITE_OTHER): Payer: PPO

## 2021-04-03 ENCOUNTER — Ambulatory Visit: Payer: PPO | Admitting: Physician Assistant

## 2021-04-03 VITALS — BP 98/58 | HR 94 | Ht 71.0 in | Wt 194.0 lb

## 2021-04-03 DIAGNOSIS — K7030 Alcoholic cirrhosis of liver without ascites: Secondary | ICD-10-CM

## 2021-04-03 DIAGNOSIS — I8510 Secondary esophageal varices without bleeding: Secondary | ICD-10-CM | POA: Diagnosis not present

## 2021-04-03 LAB — FERRITIN: Ferritin: 52.8 ng/mL (ref 22.0–322.0)

## 2021-04-03 LAB — CBC WITH DIFFERENTIAL/PLATELET
Basophils Absolute: 0 10*3/uL (ref 0.0–0.1)
Basophils Relative: 0.8 % (ref 0.0–3.0)
Eosinophils Absolute: 0.1 10*3/uL (ref 0.0–0.7)
Eosinophils Relative: 1.4 % (ref 0.0–5.0)
HCT: 37.6 % — ABNORMAL LOW (ref 39.0–52.0)
Hemoglobin: 12.4 g/dL — ABNORMAL LOW (ref 13.0–17.0)
Lymphocytes Relative: 26.2 % (ref 12.0–46.0)
Lymphs Abs: 1.5 10*3/uL (ref 0.7–4.0)
MCHC: 33 g/dL (ref 30.0–36.0)
MCV: 97.8 fl (ref 78.0–100.0)
Monocytes Absolute: 0.6 10*3/uL (ref 0.1–1.0)
Monocytes Relative: 10.5 % (ref 3.0–12.0)
Neutro Abs: 3.6 10*3/uL (ref 1.4–7.7)
Neutrophils Relative %: 61.1 % (ref 43.0–77.0)
Platelets: 121 10*3/uL — ABNORMAL LOW (ref 150.0–400.0)
RBC: 3.84 Mil/uL — ABNORMAL LOW (ref 4.22–5.81)
RDW: 15.8 % — ABNORMAL HIGH (ref 11.5–15.5)
WBC: 5.9 10*3/uL (ref 4.0–10.5)

## 2021-04-03 LAB — COMPREHENSIVE METABOLIC PANEL
ALT: 51 U/L (ref 0–53)
AST: 95 U/L — ABNORMAL HIGH (ref 0–37)
Albumin: 4 g/dL (ref 3.5–5.2)
Alkaline Phosphatase: 101 U/L (ref 39–117)
BUN: 8 mg/dL (ref 6–23)
CO2: 23 mEq/L (ref 19–32)
Calcium: 9.4 mg/dL (ref 8.4–10.5)
Chloride: 104 mEq/L (ref 96–112)
Creatinine, Ser: 0.82 mg/dL (ref 0.40–1.50)
GFR: 101.34 mL/min (ref >60.00)
Glucose, Bld: 119 mg/dL — ABNORMAL HIGH (ref 70–99)
Potassium: 3.9 mEq/L (ref 3.5–5.1)
Sodium: 138 mEq/L (ref 135–145)
Total Bilirubin: 1.6 mg/dL — ABNORMAL HIGH (ref 0.2–1.2)
Total Protein: 7.7 g/dL (ref 6.0–8.3)

## 2021-04-03 LAB — IBC PANEL
Iron: 68 ug/dL (ref 42–165)
Saturation Ratios: 16.4 % — ABNORMAL LOW (ref 20.0–50.0)
TIBC: 415.8 ug/dL (ref 250.0–450.0)
Transferrin: 297 mg/dL (ref 212.0–360.0)

## 2021-04-03 LAB — PROTIME-INR
INR: 1.3 ratio — ABNORMAL HIGH (ref 0.8–1.0)
Prothrombin Time: 13.6 s — ABNORMAL HIGH (ref 9.6–13.1)

## 2021-04-03 NOTE — Patient Instructions (Signed)
If you are age 52 or younger, your body mass index should be between 19-25. Your Body mass index is 27.06 kg/m. If this is out of the aformentioned range listed, please consider follow up with your Primary Care Provider.  ________________________________________________________  The  GI providers would like to encourage you to use [facility] to communicate with providers for non-urgent requests or questions.  Due to long hold times on the telephone, sending your provider a message by [facility] may be a faster and more efficient way to get a response.  Please allow 48 business hours for a response.  Please remember that this is for non-urgent requests.  _______________________________________________________  Your provider has requested that you go to the basement level for lab work before leaving today. Press "B" on the elevator. The lab is located at the first door on the left as you exit the elevator.  You have been scheduled for an abdominal ultrasound at [facility] (1st floor of hospital) on 04/08/2021 at 9:30 am. Please arrive 15 minutes prior to your appointment for registration. Make certain not to have anything to eat or drink 6 hours prior to your appointment. Should you need to reschedule your appointment, please contact radiology at [phone]. This test typically takes about 30 minutes to perform.  Abstain from using alcohol  Please contact the office to schedule a 3 month follow up with Dr. Silverio Decamp.  Thank you for entrusting me with your care and choosing [facility].  Ellouise Newer

## 2021-04-03 NOTE — Progress Notes (Signed)
Chief Complaint: Follow-up cirrhosis  HPI:      Miguel Guerra is a 50 year old male, assigned to Dr. Rush Landmark, with a past medical history as listed below including alcohol abuse and alcoholic cirrhosis, who presents clinic today for follow-up after an EGD and colonoscopy.    01/22/2021 patient seen in clinic by Miguel Bogus, PA-C for elevated LFTs and fatty liver.  That time discussed his heavy alcohol abuse which had stopped for a few years but is now drinking 8 ounces of wine every night.  An ultrasound was ordered as well as a full liver serologic panel to rule out other causes.  He is also set up for EGD and colonoscopy.  It does not appear ultrasound was ever completed.  Labs showed a decreased iron saturation at 10.4, normal ferritin, INR elevated at 1.2, alpha-1 antitrypsin elevated at 240.  At that time was discussed that his elevated LFTs are likely due to alcohol/fatty liver.  It was recommended he be vaccinated against hepatitis A and hepatitis B.    03/08/2021 colonoscopy with one 22 mm polyp in the rectum, diverticulosis in the sigmoid, descending and ascending colon and nonbleeding external and internal hemorrhoids.  EGD on the same day with grade 2 esophageal varices, portal hypertensive gastropathy and friable gastric mucosa.  At that time his Omeprazole was increased to 40 mg daily and he was started on Nadolol 20 mg daily for esophageal varices.  He was told to stop drinking.      Today, the patient presents to clinic, accompanied by his mother, and tells me that he seems to feel fine.  He is taking the Nadolol 20 mg daily as prescribed after time of EGD and remains on Omeprazole 40 mg daily.  Tells me that he really does not feel bad.  He has continued to drink at least 8 ounces of wine a day but tells me "I know I need to stop".  Explains that he was scheduled for an ultrasound but "I did not want to go so I canceled it".  When asked he describes he has had some abdominal distention  increasing over the past 10 years which he "cannot seem to get rid of".  Denies swelling in his legs.    Denies fever, chills, weight loss, change in bowel habits, nausea, vomiting, heartburn or reflux.  Past Medical History:  Diagnosis Date   Anxiety    Diabetes mellitus without complication ([place])    off medication x 1 month   Heartburn    Hematuria    History of alcohol abuse    Hypertension    OCD (obsessive compulsive disorder)    Other symptoms involving urinary system(788.99)    Tourette disorder     Past Surgical History:  Procedure Laterality Date   MULTIPLE TOOTH EXTRACTIONS  2011    Current Outpatient Medications  Medication Sig Dispense Refill   ALPRAZolam (XANAX) 0.5 MG tablet Take 1 tablet (0.5 mg total) by mouth 3 (three) times daily as needed for anxiety. 60 tablet 2   atorvastatin (LIPITOR) 40 MG tablet Take 40 mg by mouth daily.     insulin NPH-regular Human (70-30) 100 UNIT/ML injection Inject 20 Units into the skin 2 (two) times daily with a meal.     lisinopril (PRINIVIL,ZESTRIL) 10 MG tablet Take 10 mg by mouth daily.      metFORMIN (GLUCOPHAGE) 500 MG tablet Take 500 mg by mouth 2 (two) times daily.     nadolol (CORGARD) 20 MG tablet  Take 1 tablet (20 mg total) by mouth daily. 30 tablet 11   OLANZapine (ZYPREXA) 20 MG tablet Take 1 tablet (20 mg total) by mouth at bedtime. 90 tablet 1   omeprazole (PRILOSEC) 40 MG capsule Take 1 capsule (40 mg total) by mouth daily. 90 capsule 3   No current facility-administered medications for this visit.    Allergies as of 04/03/2021 - Review Complete 04/03/2021  Allergen Reaction Noted   Penicillins      Family History  Problem Relation Age of Onset   Breast cancer Mother    OCD Mother    Skin cancer Father    Prostate cancer Father    Pancreatic cancer Paternal Grandfather    Colon cancer Neg Hx    Esophageal cancer Neg Hx    Stomach cancer Neg Hx     Social History   Socioeconomic History   Marital  status: Single    Spouse name: Not on file   Number of children: 0   Years of education: college   Highest education level: Not on file  Occupational History   Occupation: umemployed  Tobacco Use   Smoking status: Some Days    Types: Cigars   Smokeless tobacco: Never  Vaping Use   Vaping Use: Never used  Substance and Sexual Activity   Alcohol use: No    Alcohol/week: 72.0 standard drinks    Types: 70 Cans of beer, 2 Shots of liquor per week    Comment: 2 glassees of wine per night   Drug use: No   Sexual activity: Not on file  Other Topics Concern   Not on file  Social History Narrative   Not on file   Social Determinants of Health   Financial Resource Strain: Not on file  Food Insecurity: Not on file  Transportation Needs: Not on file  Physical Activity: Not on file  Stress: Not on file  Social Connections: Not on file  Intimate Partner Violence: Not on file    Review of Systems:    Constitutional: No weight loss, fever or chills Cardiovascular: No chest pain Respiratory: No SOB Gastrointestinal: See HPI and otherwise negative   Physical Exam:  Vital signs: BP (!) 98/58   Pulse 94   Ht 5\' 11"  (1.803 m)   Wt 194 lb (88 kg)   BMI 27.06 kg/m    Constitutional:   Pleasant Caucasian male appears to be in NAD, Well developed, Well nourished, alert and cooperative Respiratory: Respirations even and unlabored. Lungs clear to auscultation bilaterally.   No wheezes, crackles, or rhonchi.  Cardiovascular: Normal S1, S2. No MRG. Regular rate and rhythm. No peripheral edema, cyanosis or pallor.  Gastrointestinal:  Soft, mild abdominal distention with positive fluid wave, nontender. No rebound or guarding. Normal bowel sounds. No appreciable masses or hepatomegaly. Rectal:  Not performed.  Psychiatric: Demonstrates good judgement and reason without abnormal affect or behaviors.  See HPI for recent labs and imaging.  Assessment: 1.  Alcoholic cirrhosis: With varices  and portal gastropathy at time of EGD, liver serologies completed and everything else was negative/normal so cause is alcohol, patient has continued to drink, question of ascites on exam today  Plan: 1.  Adamantly recommended that the patient abstain from alcohol.  Discussed that cirrhosis can kill him.  This is something that he needs to take very seriously going forward.  He verbalized understanding. 2.  Continue Nadolol 20 mg daily 3.  Continue Omeprazole 40 mg daily 4.  Patient is up-to-date  with EGD, will need repeat in 2 years 5.  We will repeat labs today including a CBC, CMP, PT/INR and AFP as well as iron studies given that this was decreased at time of last exam patient is continue to drink. 6.  Reordered right upper quadrant ultrasound for [place] screening.  Discussed this in depth with the patient and how he will need imaging every 6 months.  Also discussed cirrhosis will help [place] decipher if he has true ascites and needs to be started on a diuretic. 7.  Recommend the patient maintain a low-sodium diet and track his daily weights 8.  Discussed cirrhosis and some of the various sequelae of this including varices. 9.  Patient is getting Hep A and B vaccines through his PCP. 10.  Patient to follow in clinic in 2 to 3 months with Dr. Silverio Decamp or sooner as indicated by imaging/labs above.  Miguel Newer, PA-C [facility] 04/03/2021, 1:52 PM  Cc: Ginger Organ., MD

## 2021-04-04 ENCOUNTER — Encounter: Payer: Self-pay | Admitting: Gastroenterology

## 2021-04-04 LAB — AFP TUMOR MARKER: AFP-Tumor Marker: 6.6 ng/mL — ABNORMAL HIGH (ref <6.1)

## 2021-04-08 ENCOUNTER — Ambulatory Visit (HOSPITAL_COMMUNITY): Payer: PPO

## 2021-04-08 ENCOUNTER — Ambulatory Visit (HOSPITAL_COMMUNITY)
Admission: RE | Admit: 2021-04-08 | Discharge: 2021-04-08 | Disposition: A | Discharge status: Home / Self Care | Payer: PPO | Source: Ambulatory Visit | Attending: Physician Assistant | Admitting: Physician Assistant

## 2021-04-08 ENCOUNTER — Other Ambulatory Visit: Payer: Self-pay

## 2021-04-08 DIAGNOSIS — K7030 Alcoholic cirrhosis of liver without ascites: Secondary | ICD-10-CM | POA: Diagnosis not present

## 2021-04-08 DIAGNOSIS — K760 Fatty (change of) liver, not elsewhere classified: Secondary | ICD-10-CM | POA: Diagnosis not present

## 2021-04-09 ENCOUNTER — Other Ambulatory Visit: Payer: Self-pay

## 2021-04-09 DIAGNOSIS — K7030 Alcoholic cirrhosis of liver without ascites: Secondary | ICD-10-CM

## 2021-04-09 DIAGNOSIS — K760 Fatty (change of) liver, not elsewhere classified: Secondary | ICD-10-CM

## 2021-04-09 DIAGNOSIS — R7989 Other specified abnormal findings of blood chemistry: Secondary | ICD-10-CM

## 2021-04-22 ENCOUNTER — Other Ambulatory Visit: Payer: Self-pay

## 2021-04-22 DIAGNOSIS — D696 Thrombocytopenia, unspecified: Secondary | ICD-10-CM | POA: Insufficient documentation

## 2021-04-22 DIAGNOSIS — Z811 Family history of alcohol abuse and dependence: Secondary | ICD-10-CM | POA: Insufficient documentation

## 2021-04-22 DIAGNOSIS — Z794 Long term (current) use of insulin: Secondary | ICD-10-CM | POA: Insufficient documentation

## 2021-04-22 DIAGNOSIS — H9313 Tinnitus, bilateral: Secondary | ICD-10-CM | POA: Insufficient documentation

## 2021-04-22 DIAGNOSIS — E039 Hypothyroidism, unspecified: Secondary | ICD-10-CM | POA: Insufficient documentation

## 2021-04-22 DIAGNOSIS — E7800 Pure hypercholesterolemia, unspecified: Secondary | ICD-10-CM | POA: Insufficient documentation

## 2021-04-22 DIAGNOSIS — R042 Hemoptysis: Secondary | ICD-10-CM | POA: Insufficient documentation

## 2021-04-25 ENCOUNTER — Ambulatory Visit: Payer: PPO | Admitting: Physician Assistant

## 2021-05-14 NOTE — Progress Notes (Signed)
Reviewed and agree with documentation and assessment and plan. K. Veena Corayma Cashatt , MD   

## 2021-06-20 DIAGNOSIS — E785 Hyperlipidemia, unspecified: Secondary | ICD-10-CM | POA: Diagnosis not present

## 2021-06-20 DIAGNOSIS — E039 Hypothyroidism, unspecified: Secondary | ICD-10-CM | POA: Diagnosis not present

## 2021-06-20 DIAGNOSIS — E1149 Type 2 diabetes mellitus with other diabetic neurological complication: Secondary | ICD-10-CM | POA: Diagnosis not present

## 2021-06-20 DIAGNOSIS — I10 Essential (primary) hypertension: Secondary | ICD-10-CM | POA: Diagnosis not present

## 2021-06-20 DIAGNOSIS — Z794 Long term (current) use of insulin: Secondary | ICD-10-CM | POA: Diagnosis not present

## 2021-07-02 DIAGNOSIS — R197 Diarrhea, unspecified: Secondary | ICD-10-CM | POA: Diagnosis not present

## 2021-07-02 DIAGNOSIS — E1149 Type 2 diabetes mellitus with other diabetic neurological complication: Secondary | ICD-10-CM | POA: Diagnosis not present

## 2021-07-02 DIAGNOSIS — R109 Unspecified abdominal pain: Secondary | ICD-10-CM | POA: Diagnosis not present

## 2021-07-02 DIAGNOSIS — F1020 Alcohol dependence, uncomplicated: Secondary | ICD-10-CM | POA: Diagnosis not present

## 2021-07-02 DIAGNOSIS — R7401 Elevation of levels of liver transaminase levels: Secondary | ICD-10-CM | POA: Diagnosis not present

## 2021-07-24 ENCOUNTER — Ambulatory Visit: Payer: PPO | Admitting: Psychiatry

## 2021-07-25 ENCOUNTER — Ambulatory Visit: Payer: PPO | Admitting: Physician Assistant

## 2021-07-25 ENCOUNTER — Encounter: Payer: Self-pay | Admitting: Physician Assistant

## 2021-07-25 VITALS — BP 108/60 | HR 72 | Resp 96 | Ht 70.0 in | Wt 193.0 lb

## 2021-07-25 DIAGNOSIS — K7030 Alcoholic cirrhosis of liver without ascites: Secondary | ICD-10-CM

## 2021-07-25 DIAGNOSIS — I8510 Secondary esophageal varices without bleeding: Secondary | ICD-10-CM

## 2021-07-25 NOTE — Patient Instructions (Addendum)
Your provider has requested that you go to the basement level for lab work before leaving today. Press "B" on the elevator. The lab is located at the first door on the left as you exit the elevator.  ? ?Follow up with Dr Silverio Decamp in 6 months ? ?Your Ultrasound is due in June.  ? ?You will be contacted by [facility] in the next 2 days to arrange a Ultrasound.  The number on your caller ID will be [phone], please answer when they call.  If you have not heard from them in 2 days please call [phone] to schedule.    ? ?If you are age 66 or older, your body mass index should be between 23-30. Your Body mass index is 27.69 kg/m?Marland Kitchen If this is out of the aforementioned range listed, please consider follow up with your Primary Care Provider. ? ?If you are age 49 or younger, your body mass index should be between 19-25. Your Body mass index is 27.69 kg/m?Marland Kitchen If this is out of the aformentioned range listed, please consider follow up with your Primary Care Provider.  ? ?________________________________________________________ ? ?The [facility] providers would like to encourage you to use [facility] to communicate with providers for non-urgent requests or questions.  Due to long hold times on the telephone, sending your provider a message by [facility] may be a faster and more efficient way to get a response.  Please allow 48 business hours for a response.  Please remember that this is for non-urgent requests.  ?_______________________________________________________  ? ? ? ?Due to recent changes in healthcare laws, you may see the results of your imaging and laboratory studies on MyChart before your provider has had a chance to review them.  We understand that in some cases there may be results that are confusing or concerning to you. Not all laboratory results come back in the same time frame and the provider may be waiting for multiple results in order to interpret others.  Please give [place] 48 hours in  order for your provider to thoroughly review all the results before contacting the office for clarification of your results.   ? ?I appreciate the  opportunity to care for you ? ?Thank You  ? ?Lovett Calender ?

## 2021-07-25 NOTE — Progress Notes (Signed)
? ?Chief Complaint: Follow-up alcoholic cirrhosis ? ?HPI: ?   Miguel Guerra is a 51 year old Caucasian male with a past medical history as listed below including alcohol abuse and alcoholic cirrhosis, known to Dr. Silverio Decamp, who returns to clinic today for follow-up of his alcoholic cirrhosis. ?    01/22/2021 patient seen in clinic by Alonza Bogus, PA-C for elevated LFTs and fatty liver.  That time discussed his heavy alcohol abuse which had stopped for a few years but is now drinking 8 ounces of wine every night.  An ultrasound was ordered as well as a full liver serologic panel to rule out other causes.  He is also set up for EGD and colonoscopy.  It does not appear ultrasound was ever completed.  Labs showed a decreased iron saturation at 10.4, normal ferritin, INR elevated at 1.2, alpha-1 antitrypsin elevated at 240.  At that time was discussed that his elevated LFTs are likely due to alcohol/fatty liver.  It was recommended he be vaccinated against hepatitis A and hepatitis B. ?   03/08/2021 colonoscopy with one 22 mm polyp in the rectum, diverticulosis in the sigmoid, descending and ascending colon and nonbleeding external and internal hemorrhoids.  EGD on the same day with grade 2 esophageal varices, portal hypertensive gastropathy and friable gastric mucosa.  At that time his Omeprazole was increased to 40 mg daily and he was started on Nadolol 20 mg daily for esophageal varices.  He was told to stop drinking.  ?   04/03/2021 patient seen in clinic for follow-up of alcoholic cirrhosis and was taking Nadolol 20 mg and remained on Omeprazole 40 mg daily.  At that time feeling fairly well but continued to drink 8 ounces of wine a day.  At that time mild abdominal distention with positive fluid wave on exam.  Recommended complete alcohol cessation, continue Nadolol 20 mg daily and Omeprazole 40 mg daily.  Discussed he was up-to-date with EGD and would need repeat in 2 years.  Repeated labs that day and reordered right  upper quadrant ultrasound for [place] screening.  Recommended a low-sodium diet and daily weight checks.  He was getting his hep a and B vaccines through PCP. ?   04/03/2021 iron panel with a percent saturation low at 16.4 and otherwise normal. ?   04/09/2021 right upper quadrant ultrasound with large amount of gallbladder sludge and no evidence of cholelithiasis or acute cholecystitis as well as hepatic steatosis without focal liver lesions.  aFP was minimally elevated 6.6.  CMP with a bilirubin of 1.6 and AST of 95.  CBC with a hemoglobin of 12.4 and platelets low at 121.  PT/INR 1.3/13.6. ?   Today, patient tells me that he just has a question about his thyroid medication.  He is unaware that we are at the gastrointestinal doctors and take care of his cirrhosis.  As far as all of that he tells me that he has now gone to just one shot of whiskey a night.  He knows he supposed to stop but he is working on it.  Denies any increase in abdominal distention lately and no abdominal pain.  He has been monitoring his daily weights at home and this has not changed drastically.  He is taking all of his other medications.  He does not have any questions or concerns about his cirrhosis. ?   Denies fever, chills, weight loss or change in bowel habits. ? ?Past Medical History:  ?Diagnosis Date  ? Anxiety   ? Diabetes  mellitus without complication ([place])   ? off medication x 1 month  ? Heartburn   ? Hematuria   ? History of alcohol abuse   ? Hypertension   ? OCD (obsessive compulsive disorder)   ? Other symptoms involving urinary system(788.99)   ? Tourette disorder   ? ? ?Past Surgical History:  ?Procedure Laterality Date  ? MULTIPLE TOOTH EXTRACTIONS  2011  ? ? ?Current Outpatient Medications  ?Medication Sig Dispense Refill  ? ALPRAZolam (XANAX) 0.5 MG tablet Take 1 tablet (0.5 mg total) by mouth 3 (three) times daily as needed for anxiety. 60 tablet 2  ? atorvastatin (LIPITOR) 40 MG tablet Take 40 mg by mouth daily.    ? ezetimibe  (ZETIA) 10 MG tablet 10 mg daily.    ? insulin NPH-regular Human (70-30) 100 UNIT/ML injection Inject 20 Units into the skin 2 (two) times daily with a meal.    ? levothyroxine (SYNTHROID) 50 MCG tablet 50 mcg.    ? lisinopril (PRINIVIL,ZESTRIL) 10 MG tablet Take 10 mg by mouth daily.     ? metFORMIN (GLUCOPHAGE) 500 MG tablet Take 500 mg by mouth 2 (two) times daily.    ? nadolol (CORGARD) 20 MG tablet Take 1 tablet (20 mg total) by mouth daily. 30 tablet 11  ? OLANZapine (ZYPREXA) 20 MG tablet Take 1 tablet (20 mg total) by mouth at bedtime. 90 tablet 1  ? omeprazole (PRILOSEC) 40 MG capsule Take 1 capsule (40 mg total) by mouth daily. 90 capsule 3  ? ?No current facility-administered medications for this visit.  ? ? ?Allergies as of 07/25/2021 - Review Complete 04/03/2021  ?Allergen Reaction Noted  ? Penicillins    ? ? ?Family History  ?Problem Relation Age of Onset  ? Breast cancer Mother   ? OCD Mother   ? Skin cancer Father   ? Prostate cancer Father   ? Pancreatic cancer Paternal Grandfather   ? Colon cancer Neg Hx   ? Esophageal cancer Neg Hx   ? Stomach cancer Neg Hx   ? ? ?Social History  ? ?Socioeconomic History  ? Marital status: Single  ?  Spouse name: Not on file  ? Number of children: 0  ? Years of education: college  ? Highest education level: Not on file  ?Occupational History  ? Occupation: umemployed  ?Tobacco Use  ? Smoking status: Some Days  ?  Types: Cigars  ? Smokeless tobacco: Never  ?Vaping Use  ? Vaping Use: Never used  ?Substance and Sexual Activity  ? Alcohol use: No  ?  Alcohol/week: 72.0 standard drinks  ?  Types: 70 Cans of beer, 2 Shots of liquor per week  ?  Comment: 2 glassees of wine per night  ? Drug use: No  ? Sexual activity: Not on file  ?Other Topics Concern  ? Not on file  ?Social History Narrative  ? Not on file  ? ?Social Determinants of Health  ? ?Financial Resource Strain: Not on file  ?Food Insecurity: Not on file  ?Transportation Needs: Not on file  ?Physical Activity:  Not on file  ?Stress: Not on file  ?Social Connections: Not on file  ?Intimate Partner Violence: Not on file  ? ? ?Review of Systems:    ?Constitutional: No weight loss, fever or chills ?Cardiovascular: No chest pain  ?Respiratory: No SOB  ?Gastrointestinal: See HPI and otherwise negative ? ? Physical Exam:  ?Vital signs: ?BP 108/60   Pulse 72   Resp (!) 96  Ht '5\' 10"'$  (1.778 m)   Wt 193 lb (87.5 kg)   BMI 27.69 kg/m?   ? ?Constitutional:   Pleasant Caucasian male appears to be in NAD, Well developed, Well nourished, alert and cooperative ?Respiratory: Respirations even and unlabored. Lungs clear to auscultation bilaterally.   No wheezes, crackles, or rhonchi.  ?Cardiovascular: Normal S1, S2. No MRG. Regular rate and rhythm. No peripheral edema, cyanosis or pallor.  ?Gastrointestinal:  Soft, nondistended, nontender. No rebound or guarding. Normal bowel sounds. No appreciable masses or hepatomegaly.+protuberant abdomen ?Rectal:  Not performed.  ?Psychiatric: Oriented to person, place and time. Demonstrates good judgement and reason without abnormal affect or behaviors. ? ?RELEVANT LABS AND IMAGING: ?CBC ?   ?Component Value Date/Time  ? WBC 5.9 04/03/2021 1431  ? RBC 3.84 (L) 04/03/2021 1431  ? HGB 12.4 (L) 04/03/2021 1431  ? HCT 37.6 (L) 04/03/2021 1431  ? PLT 121.0 (L) 04/03/2021 1431  ? MCV 97.8 04/03/2021 1431  ? MCHC 33.0 04/03/2021 1431  ? RDW 15.8 (H) 04/03/2021 1431  ? LYMPHSABS 1.5 04/03/2021 1431  ? MONOABS 0.6 04/03/2021 1431  ? EOSABS 0.1 04/03/2021 1431  ? BASOSABS 0.0 04/03/2021 1431  ? ? ?CMP  ?   ?Component Value Date/Time  ? NA 138 04/03/2021 1431  ? NA 137 02/22/2014 0834  ? K 3.9 04/03/2021 1431  ? CL 104 04/03/2021 1431  ? CO2 23 04/03/2021 1431  ? GLUCOSE 119 (H) 04/03/2021 1431  ? BUN 8 04/03/2021 1431  ? BUN 14 02/22/2014 0834  ? CREATININE 0.82 04/03/2021 1431  ? CALCIUM 9.4 04/03/2021 1431  ? PROT 7.7 04/03/2021 1431  ? PROT 7.1 02/22/2014 0834  ? ALBUMIN 4.0 04/03/2021 1431  ? ALBUMIN  4.7 02/22/2014 0834  ? AST 95 (H) 04/03/2021 1431  ? ALT 51 04/03/2021 1431  ? ALKPHOS 101 04/03/2021 1431  ? BILITOT 1.6 (H) 04/03/2021 1431  ? GFRNONAA 79 02/22/2014 0834  ? GFRAA 92 02/22/2014 0834  ?

## 2021-07-26 DIAGNOSIS — Z125 Encounter for screening for malignant neoplasm of prostate: Secondary | ICD-10-CM | POA: Diagnosis not present

## 2021-07-26 DIAGNOSIS — E039 Hypothyroidism, unspecified: Secondary | ICD-10-CM | POA: Diagnosis not present

## 2021-07-26 DIAGNOSIS — E785 Hyperlipidemia, unspecified: Secondary | ICD-10-CM | POA: Diagnosis not present

## 2021-07-26 DIAGNOSIS — E1149 Type 2 diabetes mellitus with other diabetic neurological complication: Secondary | ICD-10-CM | POA: Diagnosis not present

## 2021-07-26 DIAGNOSIS — I10 Essential (primary) hypertension: Secondary | ICD-10-CM | POA: Diagnosis not present

## 2021-07-31 NOTE — Progress Notes (Signed)
Reviewed and agree with documentation and assessment and plan. K. Veena Elysa Womac , MD   

## 2021-08-02 DIAGNOSIS — H9203 Otalgia, bilateral: Secondary | ICD-10-CM | POA: Diagnosis not present

## 2021-08-02 DIAGNOSIS — I8510 Secondary esophageal varices without bleeding: Secondary | ICD-10-CM | POA: Diagnosis not present

## 2021-08-02 DIAGNOSIS — R82998 Other abnormal findings in urine: Secondary | ICD-10-CM | POA: Diagnosis not present

## 2021-08-02 DIAGNOSIS — I10 Essential (primary) hypertension: Secondary | ICD-10-CM | POA: Diagnosis not present

## 2021-08-02 DIAGNOSIS — K7030 Alcoholic cirrhosis of liver without ascites: Secondary | ICD-10-CM | POA: Diagnosis not present

## 2021-08-02 DIAGNOSIS — Z23 Encounter for immunization: Secondary | ICD-10-CM | POA: Diagnosis not present

## 2021-08-02 DIAGNOSIS — K760 Fatty (change of) liver, not elsewhere classified: Secondary | ICD-10-CM | POA: Diagnosis not present

## 2021-08-02 DIAGNOSIS — D696 Thrombocytopenia, unspecified: Secondary | ICD-10-CM | POA: Diagnosis not present

## 2021-08-02 DIAGNOSIS — F1020 Alcohol dependence, uncomplicated: Secondary | ICD-10-CM | POA: Diagnosis not present

## 2021-08-02 DIAGNOSIS — Z1331 Encounter for screening for depression: Secondary | ICD-10-CM | POA: Diagnosis not present

## 2021-08-02 DIAGNOSIS — E1149 Type 2 diabetes mellitus with other diabetic neurological complication: Secondary | ICD-10-CM | POA: Diagnosis not present

## 2021-08-02 DIAGNOSIS — F319 Bipolar disorder, unspecified: Secondary | ICD-10-CM | POA: Diagnosis not present

## 2021-08-02 DIAGNOSIS — Z1339 Encounter for screening examination for other mental health and behavioral disorders: Secondary | ICD-10-CM | POA: Diagnosis not present

## 2021-08-02 DIAGNOSIS — E876 Hypokalemia: Secondary | ICD-10-CM | POA: Diagnosis not present

## 2021-08-02 DIAGNOSIS — Z0000 Encounter for general adult medical examination without abnormal findings: Secondary | ICD-10-CM | POA: Diagnosis not present

## 2021-08-02 DIAGNOSIS — E785 Hyperlipidemia, unspecified: Secondary | ICD-10-CM | POA: Diagnosis not present

## 2021-08-05 ENCOUNTER — Other Ambulatory Visit: Payer: Self-pay | Admitting: Internal Medicine

## 2021-08-05 DIAGNOSIS — F17201 Nicotine dependence, unspecified, in remission: Secondary | ICD-10-CM

## 2021-08-07 DIAGNOSIS — C4442 Squamous cell carcinoma of skin of scalp and neck: Secondary | ICD-10-CM | POA: Diagnosis not present

## 2021-08-07 DIAGNOSIS — Z86007 Personal history of in-situ neoplasm of skin: Secondary | ICD-10-CM | POA: Diagnosis not present

## 2021-08-07 DIAGNOSIS — D485 Neoplasm of uncertain behavior of skin: Secondary | ICD-10-CM | POA: Diagnosis not present

## 2021-08-07 DIAGNOSIS — Z08 Encounter for follow-up examination after completed treatment for malignant neoplasm: Secondary | ICD-10-CM | POA: Diagnosis not present

## 2021-08-07 DIAGNOSIS — L218 Other seborrheic dermatitis: Secondary | ICD-10-CM | POA: Diagnosis not present

## 2021-08-07 DIAGNOSIS — L570 Actinic keratosis: Secondary | ICD-10-CM | POA: Diagnosis not present

## 2021-08-07 DIAGNOSIS — Z85828 Personal history of other malignant neoplasm of skin: Secondary | ICD-10-CM | POA: Diagnosis not present

## 2021-08-12 ENCOUNTER — Encounter: Payer: Self-pay | Admitting: Psychiatry

## 2021-08-12 ENCOUNTER — Ambulatory Visit: Payer: PPO | Admitting: Psychiatry

## 2021-08-12 DIAGNOSIS — F4001 Agoraphobia with panic disorder: Secondary | ICD-10-CM

## 2021-08-12 DIAGNOSIS — F4010 Social phobia, unspecified: Secondary | ICD-10-CM | POA: Diagnosis not present

## 2021-08-12 DIAGNOSIS — F250 Schizoaffective disorder, bipolar type: Secondary | ICD-10-CM | POA: Diagnosis not present

## 2021-08-12 DIAGNOSIS — F952 Tourette's disorder: Secondary | ICD-10-CM | POA: Diagnosis not present

## 2021-08-12 MED ORDER — OLANZAPINE 20 MG PO TABS: 20.0000 mg | ORAL_TABLET | Freq: Every day | ORAL | 1 refills | Status: DC

## 2021-08-12 MED ORDER — ALPRAZOLAM 0.5 MG PO TABS: 0.5000 mg | ORAL_TABLET | Freq: Two times a day (BID) | ORAL | 4 refills | Status: DC | PRN

## 2021-08-12 NOTE — Progress Notes (Signed)
Miguel Guerra ?[id number] ?[birth year] ?51 y.o. ? ?Subjective:  ? ?Patient ID:  Miguel Guerra is a 51 y.o. (DOB 1970-09-08) male. ? ?Chief Complaint:  ?Chief Complaint  ?Patient presents with  ? Follow-up  ? Hallucinations  ? ? ?Anxiety ?Symptoms include nervous/anxious behavior. Patient reports no confusion, decreased concentration, palpitations or suicidal ideas.  ? ? ?Miguel Guerra presents to the office today for follow-up of  tourette's disorder and psychosis. ? ?02/2019 appt with the following noted: ?consistent with meds.  Status quo with sx as noted below.   ?Orap helps with tics but doesn't eliminate them.  It's awkward socially some but he doesn't have friends or go out in public much anyway bc of Covid.  I notice it all the time but has had facial and head tics since childhood.  Neurologist originally Rx the Orap and then PCP took it over.   ?Off and on visual hallucinations of people, bugs and animals and sometimes scary.  Still gets depressed but at baseline.  Satisfied with meds. ?Labs with PCP. Hyponatremia dt excessive water.  Stopped beer.  Not abusing alcohol.  Quit smoking cigarettes after smoking 2ppd for 16 years.  Already feels better. ?Overall doing ok with good days and bad days but stable compared to before. ?Sleep pretty good with meds.  Still sees shower people in the shower, 5 different people.  Can't explain that one.  Also sees images of people in other locations too.  Can see people walk in his house.  They are not always clear.  Not hearing voices for awhile.  Last AH a couple of mos ago.  May hear voices that wake him from sleep.  Sleeping better off cigarettes.  At first it scared him, but has gottten more used to it.   Occ feels watched.  No recent voices and hallucinations no longer waking him.  Not lasting depression.  Does have anxiety including social and panic.  Panic attacks like a heart attack and are scary.  Has had to leave store DT panic before.  Tries to avoid people.   Does feel paranoid around people, feels they watch him.  Usually still going to gym now and enjoys the progress he sees. ?Quit drinking.  Some weight gain. Quit smoking and caffeine. ?Only taking 0.5 mg Xanax daily. Not abusing.  Can still have panic in public. ?Pending MRI head bc slurred speech and can't write anymore and hard to read.  ? Ministroke.  Had speech therapy as akid. ?Plan: No medication changes indicated except stop risperidone bc using Orap for tics now. ?   ?01/25/20 appt with the following noted: ?Had 2 weeks straight of seeing a person in front of him, usually a man, but then it went away.  Happened at home and parents' home.  It did not talk.  Frightens him when it happens.  You'd think I'd be used to it.  occ hears voices but overall feels psychotic sx are manageable. ?Stopped Orap bc of money. ?Sleeps well with olanzapine.   ?No beer in 8 mos.  Occ glass of wine at dinner.  Lost 20# off alcohol.  Also on a diet.  Never drank liquor.   ?Takes Xanax every AM for anxiety.   ?Tics involve oral and facial and neck movements and masks help hide this so is ok off Orap which he couldn't afford. He may want to try meds later. ?Plan: Continue olanzapine 20 mg HS ?He's not sure but thinks he stopped the lamotrigine. ?  ?  07/23/2020 appointment with the following noted: ?Hanging in there.  About the same.  Still see things in the house but handles it.  Day or night can feel something is looking at me.  Turns around and sees it briefly and it dissipates.  Not talking to him.  Nothing too scary.   ?Anxiety managed with Xanax AM and rarely thereafter.   ?Occ depression but not all the time. ?No problems with olanzapine which helps him sleep.  ?Taking meds for DM.  Had DM for 6-7 years he thinks. ?Plan: Continue olanzapine 20 mg HS  ?And alprazolam.   ? ?01/24/21 appt noted: ?Good and bad days.  Excellent until 2-3 weeks ago started seeing people in the house staring at him after waking from sleep and once awake a  while then they go away.  But does scare him.  3-4 mos ago nothing was happenining.  Sleep unchanged and good. ?Takes about Xanax 0.5 mg per day/  people can make him anxious.  It does help but not abused. ?Stress money but mother helps him out.  Her health is good.  F NHP for dementia. ?No SE meds.  Depression managed. ?Tics come and go and doesn't like them but can't afford Orap.  Might get from [place]. ?Plan: continue alprazolam and olanzapine 20 mg HS ? ?08/12/21 appt noted: ?Takes Xanax 0.5 mg mainly in am.  Consistent with olanzapine 20 mg HS. ?Still sees people in his house or parents house.  Afraid to go in parents house alone bc seen demonic things there.  Not afraid at his house but seen people talking to each other. ?No SE meds.  Depression managed. ?Tics come and go and doesn't like them but can't afford Orap.  ? ?Patient on disability ? ?Past Psychiatric Medication Trials: risperidone, Orap $, lamotrigine ? ? ?Review of Systems:  ?Review of Systems  ?Cardiovascular:  Negative for palpitations.  ?Gastrointestinal:  Negative for abdominal distention and diarrhea.  ?Neurological:  Negative for tremors.  ?Psychiatric/Behavioral:  Positive for hallucinations. Negative for agitation, behavioral problems, confusion, decreased concentration, dysphoric mood, self-injury, sleep disturbance and suicidal ideas. The patient is nervous/anxious. The patient is not hyperactive.   ? ?Medications: I have reviewed the patient's current medications. ? ?Current Outpatient Medications  ?Medication Sig Dispense Refill  ? atorvastatin (LIPITOR) 40 MG tablet Take 40 mg by mouth daily.    ? insulin NPH-regular Human (70-30) 100 UNIT/ML injection Inject 20 Units into the skin 2 (two) times daily with a meal.    ? levothyroxine (SYNTHROID) 50 MCG tablet 50 mcg.    ? lisinopril (PRINIVIL,ZESTRIL) 10 MG tablet Take 10 mg by mouth daily.     ? metFORMIN (GLUCOPHAGE) 500 MG tablet Take 500 mg by mouth 2 (two) times daily.    ? nadolol  (CORGARD) 20 MG tablet Take 1 tablet (20 mg total) by mouth daily. 30 tablet 11  ? omeprazole (PRILOSEC) 40 MG capsule Take 1 capsule (40 mg total) by mouth daily. 90 capsule 3  ? ALPRAZolam (XANAX) 0.5 MG tablet Take 1 tablet (0.5 mg total) by mouth 2 (two) times daily as needed for anxiety. 60 tablet 4  ? OLANZapine (ZYPREXA) 20 MG tablet Take 1 tablet (20 mg total) by mouth at bedtime. 90 tablet 1  ? ?No current facility-administered medications for this visit.  ? ? ?Medication Side Effects: Other: dry mouth ? ?Allergies:  ?Allergies  ?Allergen Reactions  ? Penicillins   ?  REACTION: Swelling of Neck  ? ? ?  Past Medical History:  ?Diagnosis Date  ? Anxiety   ? Cirrhosis of liver ([place])   ? Diabetes mellitus without complication ([place])   ? off medication x 1 month  ? Heartburn   ? Hematuria   ? History of alcohol abuse   ? Hypertension   ? OCD (obsessive compulsive disorder)   ? Other symptoms involving urinary system(788.99)   ? Tourette disorder   ? ? ?Family History  ?Problem Relation Age of Onset  ? Breast cancer Mother   ? OCD Mother   ? Skin cancer Father   ? Prostate cancer Father   ? Pancreatic cancer Paternal Grandfather   ? Colon cancer Neg Hx   ? Esophageal cancer Neg Hx   ? Stomach cancer Neg Hx   ? ? ?Social History  ? ?Socioeconomic History  ? Marital status: Single  ?  Spouse name: Not on file  ? Number of children: 0  ? Years of education: college  ? Highest education level: Not on file  ?Occupational History  ? Occupation: umemployed  ?Tobacco Use  ? Smoking status: Former  ?  Types: Cigars  ? Smokeless tobacco: Never  ?Vaping Use  ? Vaping Use: Never used  ?Substance and Sexual Activity  ? Alcohol use: No  ?  Alcohol/week: 72.0 standard drinks  ?  Types: 70 Cans of beer, 2 Shots of liquor per week  ?  Comment: 1 shot a day  ? Drug use: No  ? Sexual activity: Not on file  ?Other Topics Concern  ? Not on file  ?Social History Narrative  ? Not on file  ? ?Social Determinants of Health  ? ?Financial  Resource Strain: Not on file  ?Food Insecurity: Not on file  ?Transportation Needs: Not on file  ?Physical Activity: Not on file  ?Stress: Not on file  ?Social Connections: Not on file  ?Intimate Partner V

## 2021-09-16 DIAGNOSIS — C4442 Squamous cell carcinoma of skin of scalp and neck: Secondary | ICD-10-CM | POA: Diagnosis not present

## 2021-09-17 ENCOUNTER — Ambulatory Visit: Payer: PPO | Admitting: Gastroenterology

## 2021-09-24 DIAGNOSIS — Z794 Long term (current) use of insulin: Secondary | ICD-10-CM | POA: Diagnosis not present

## 2021-09-24 DIAGNOSIS — E1149 Type 2 diabetes mellitus with other diabetic neurological complication: Secondary | ICD-10-CM | POA: Diagnosis not present

## 2021-09-24 DIAGNOSIS — E785 Hyperlipidemia, unspecified: Secondary | ICD-10-CM | POA: Diagnosis not present

## 2021-09-24 DIAGNOSIS — I10 Essential (primary) hypertension: Secondary | ICD-10-CM | POA: Diagnosis not present

## 2021-10-08 ENCOUNTER — Telehealth: Payer: Self-pay

## 2021-10-08 NOTE — Telephone Encounter (Signed)
Lm on vm asking that patient return call.  RUQ [place] order in epic. Secure staff message sent to radiology scheduling to contact pt to set up appt.

## 2021-10-08 NOTE — Telephone Encounter (Signed)
-----   Message from Yevette Edwards, RN sent at 04/09/2021  4:40 PM EST ----- Regarding: [place] RUQ US-cirrhosis, [place] screening

## 2021-10-09 DIAGNOSIS — R42 Dizziness and giddiness: Secondary | ICD-10-CM | POA: Diagnosis not present

## 2021-10-09 DIAGNOSIS — R7989 Other specified abnormal findings of blood chemistry: Secondary | ICD-10-CM | POA: Diagnosis not present

## 2021-10-09 DIAGNOSIS — K760 Fatty (change of) liver, not elsewhere classified: Secondary | ICD-10-CM | POA: Diagnosis not present

## 2021-10-09 DIAGNOSIS — I10 Essential (primary) hypertension: Secondary | ICD-10-CM | POA: Diagnosis not present

## 2021-10-09 DIAGNOSIS — E1149 Type 2 diabetes mellitus with other diabetic neurological complication: Secondary | ICD-10-CM | POA: Diagnosis not present

## 2021-10-09 DIAGNOSIS — E785 Hyperlipidemia, unspecified: Secondary | ICD-10-CM | POA: Diagnosis not present

## 2021-10-09 DIAGNOSIS — K7030 Alcoholic cirrhosis of liver without ascites: Secondary | ICD-10-CM | POA: Diagnosis not present

## 2021-10-09 DIAGNOSIS — R7401 Elevation of levels of liver transaminase levels: Secondary | ICD-10-CM | POA: Diagnosis not present

## 2021-10-09 DIAGNOSIS — E876 Hypokalemia: Secondary | ICD-10-CM | POA: Diagnosis not present

## 2021-10-09 DIAGNOSIS — R296 Repeated falls: Secondary | ICD-10-CM | POA: Diagnosis not present

## 2021-10-10 NOTE — Telephone Encounter (Signed)
Lm on vm for patient to return call 

## 2021-10-14 NOTE — Telephone Encounter (Signed)
Called and spoke with the patient to remind him that he is due for his routine [place] at this time. Pt states that radiology scheduling did call him twice last week but [place]. I told pt to give radiology scheduling a call back when he returns to town. Pt verbalized understanding and had no concerns at the end of the call.

## 2021-11-29 DIAGNOSIS — H5213 Myopia, bilateral: Secondary | ICD-10-CM | POA: Diagnosis not present

## 2021-11-29 DIAGNOSIS — E119 Type 2 diabetes mellitus without complications: Secondary | ICD-10-CM | POA: Diagnosis not present

## 2021-12-18 DIAGNOSIS — D044 Carcinoma in situ of skin of scalp and neck: Secondary | ICD-10-CM | POA: Diagnosis not present

## 2021-12-23 DIAGNOSIS — R42 Dizziness and giddiness: Secondary | ICD-10-CM | POA: Diagnosis not present

## 2021-12-23 DIAGNOSIS — K7682 Hepatic encephalopathy: Secondary | ICD-10-CM | POA: Diagnosis not present

## 2021-12-23 DIAGNOSIS — E876 Hypokalemia: Secondary | ICD-10-CM | POA: Diagnosis not present

## 2021-12-23 DIAGNOSIS — G9340 Encephalopathy, unspecified: Secondary | ICD-10-CM | POA: Diagnosis not present

## 2021-12-23 DIAGNOSIS — R296 Repeated falls: Secondary | ICD-10-CM | POA: Diagnosis not present

## 2021-12-23 DIAGNOSIS — E1149 Type 2 diabetes mellitus with other diabetic neurological complication: Secondary | ICD-10-CM | POA: Diagnosis not present

## 2021-12-23 DIAGNOSIS — I10 Essential (primary) hypertension: Secondary | ICD-10-CM | POA: Diagnosis not present

## 2021-12-23 DIAGNOSIS — K7030 Alcoholic cirrhosis of liver without ascites: Secondary | ICD-10-CM | POA: Diagnosis not present

## 2021-12-23 DIAGNOSIS — R7401 Elevation of levels of liver transaminase levels: Secondary | ICD-10-CM | POA: Diagnosis not present

## 2022-01-29 NOTE — Progress Notes (Unsigned)
01/30/2022 Miguel Guerra [id number] 06/04/1970  Referring provider: Ginger Organ., MD Primary GI doctor: Dr. Silverio Decamp  ASSESSMENT AND PLAN:   Alcoholic cirrhosis of liver without ascites ([place]) with PHTN, varices, thrombocytopenia, now with HE and continues to drink/have non compliance -     CBC with Differential/Platelet; Future -     Comprehensive metabolic panel; Future -     Protime-INR; Future -     AFP tumor marker; Future -     Magnesium; Future -     rifaximin (XIFAXAN) 550 MG TABS tablet; Take 1 tablet (550 mg total) by mouth 2 (two) times daily. -     US Abdomen Complete; Future Will calculate MELD with new labs today. -Plan for AB [place] to rule out ascites and check for [facility]. -Nutrition and low sodium diet discussed with patient and information given -Continue daily multivitamin -Recommended 30 minutes of aerobic and resistance exercise 3 days/week  Encephalopathy, hepatic (HCC) -     rifaximin (XIFAXAN) 550 MG TABS tablet; Take 1 tablet (550 mg total) by mouth 2 (two) times daily. -     Ammonia; Future Possible trigger: Alcohol use likely is the trigger, encouraged to not drink.  Get on thiamine/MVIT - Lactulose 27m TID titrate to 3bms per day - will add on Rifaximin 5584mbid - minimize/remove all benzos and narcotics -Patient advised to not drive due to impaired reflexes - add on zinc  Esophageal varices in [place] Last EGD was 03/08/2021, showed grade 2 esophageal varices, PHG, nadolol 20 mg started tolerating well with BP and HR.  Recall EGD 03/2023. Patient having some progressive dysphagia, will plan for EGD in the hospital to evaluate.  No melena or signs of bleeding ER precautions discussed with the patient.  Rectal benign neoplasm 03/08/2021 colonoscopy with one 22 mm polyp in the rectum, diverticulosis in the sigmoid, descending and ascending colon and nonbleeding external and internal hemorrhoids.   Insulin  dependent type 2 diabetes mellitus ([place]  History of Present Illness:  [age].o. male with medical history significant for insulin-dependent type 2 diabetes, hypothyroidism, hypertension, bipolar disorder, alcohol dependence, thrombocytopenia with cirrhosis, GERD, presents for follow up of cirrhosis secondary to ETOH.  Last EGD was 03/08/2021, showed grade 2 esophageal varices, PHG, nadolol 20 mg started tolerating well with BP and HR.  Recall EGD 03/2023. 04/09/2021 right upper quadrant ultrasound with large amount of gallbladder sludge and no evidence of cholelithiasis or acute cholecystitis as well as hepatic steatosis without focal liver lesions.   Patient was seen 07/25/2021 by PA LeFabio Asawas still drinking 1 shot of whiskey a night.  Labs reviewed from primary care on 12/23/2021 show AST 99, ALT 61, alk phos 89, total bilirubin 3.9, kidney and potassium normal.   White blood cell count 3.9, hemoglobin 10.8, hematocrit 30.9, MCV 95, platelets 120.  Ammonia 211 An INR and AFP were not obtained.  [facility]/2021 [facility].   At that visit admitted to alcohol 1-2 small glasses of wine daily.   Has history history of elevated ammonia June 2023 but never took lactulose or Xifaxan.  last Ammonia was 211.  He has some slurring of his words here slightly.  Has been on lactulose 30 ml TID, states he felt it was more effective in the beginning versus previously.  He states he can have one BM or occ none and has a lot of gas.  He  has had chills but no fever.  No coughing, wheezing, SOB. No burning when he urinates.  No new medications.  He has been having progressively worse dysphagia for last 6 months, daily and progressive mainly with meats.  Denies melena or hematochezia.  Takes omeprazole for GERD for years and states well controlled. Marland Kitchen   He denies swelling.  He is not on spironolactone or lasix.  His weight is down significantly,  down 20-30 lbs. States he is not drinking as much as he use to, he will have 1 12 oz can a day, will buy one a day and not keep multiple in his house or he will want them.  He does understand that he needs to complete stop, he states he plans on stopping this Sunday so that he can tell his PCP that he has not had a drink.   Wt Readings from Last 3 Encounters:  01/30/22 167 lb 4 oz (75.9 kg)  07/25/21 193 lb (87.5 kg)  04/03/21 194 lb (88 kg)    Last HCC screen: 04/08/2021 DUE FOR REPEAT Last AFP 04/03/2021 6.6  Last INR: 04/03/2021 1.3  Hepatitis immunity status: Suppose to get Hep AB vaccines with PCP 01/22/2021 HepA NON-REACTIVE  01/22/2021 HepBsAG NON-REACTIVE  01/22/2021 HepAsAB NON-REACTIVE  01/22/2021 HepCAB NON-REACTIVE   Previous hepatocellular work up negative 01/2021. 03/08/2021 colonoscopy with one 22 mm polyp in the rectum, diverticulosis in the sigmoid, descending and ascending colon and nonbleeding external and internal hemorrhoids.   EGD on the same day with grade 2 esophageal varices, portal hypertensive gastropathy and friable gastric mucosa.  At that time his Omeprazole was increased to 40 mg daily and he was started on Nadolol 20 mg daily for esophageal varices.  He  reports that he has quit smoking. His smoking use included cigarettes. He has never used smokeless tobacco. He reports that he does not currently use alcohol. He reports that he does not use drugs. His family history includes Breast cancer in his mother; OCD in his mother; Pancreatic cancer in his paternal grandfather; Prostate cancer in his father; Skin cancer in his father.   Current Medications:   Current Outpatient Medications (Endocrine & Metabolic):    insulin NPH-regular Human (70-30) 100 UNIT/ML injection, Inject 20 Units into the skin 2 (two) times daily with a meal.   levothyroxine (SYNTHROID) 50 MCG tablet, Take 50 mcg by mouth daily before breakfast.   metFORMIN (GLUCOPHAGE) 500 MG tablet, Take  500 mg by mouth 2 (two) times daily.  Current Outpatient Medications (Cardiovascular):    atorvastatin (LIPITOR) 40 MG tablet, Take 40 mg by mouth daily.   lisinopril (PRINIVIL,ZESTRIL) 10 MG tablet, Take 10 mg by mouth daily.    nadolol (CORGARD) 20 MG tablet, Take 1 tablet (20 mg total) by mouth daily.     Current Outpatient Medications (Other):    ALPRAZolam (XANAX) 0.5 MG tablet, Take 1 tablet (0.5 mg total) by mouth 2 (two) times daily as needed for anxiety.   lactulose (CHRONULAC) 10 GM/15ML solution, Take 20 g by mouth 3 (three) times daily.   OLANZapine (ZYPREXA) 20 MG tablet, Take 1 tablet (20 mg total) by mouth at bedtime.   omeprazole (PRILOSEC) 40 MG capsule, Take 1 capsule (40 mg total) by mouth daily.   rifaximin (XIFAXAN) 550 MG TABS tablet, Take 1 tablet (550 mg total) by mouth 2 (two) times daily.  Surgical History:  He  has a past surgical history that includes Multiple tooth extractions (2011).  Current Medications, Allergies,  Past Medical History, Past Surgical History, Family History and Social History were reviewed in [employer] record.  Physical Exam: BP 110/66   Pulse 80   Ht '5\' 10"'  (1.778 m)   Wt 167 lb 4 oz (75.9 kg)   BMI 24.00 kg/m  General :  Alert, well developed male in no acute distress Head:  Normocephalic and atraumatic. Eyes :  sclerae anicteric,conjunctive pink  Heart:  regular rate and rhythm Pulm:  Clear anteriorly; no wheezing Abdomen:   Soft, Obese AB, skin exam [place], Normal bowel sounds. mild tenderness in the lower abdomen. Without guarding and Without rebound, hepatomegaly noted. no  fluid wave, shifting dullness noted slightly.  Extremities:   Without edema. Msk:  Symmetrical without gross deformities. Peripheral pulses intact.  Neurologic: Alert and  oriented x4;  grossly normal neurologically but he does have some slowed mentation with asterixis or clonus.  Skin:   without jaundice. no palmar erythema  or spider angioma.   Psychiatric:  Demonstrates good judgement and reason, some slow affect   Vladimir Crofts, PA-C 01/30/22

## 2022-01-30 ENCOUNTER — Telehealth: Payer: Self-pay

## 2022-01-30 ENCOUNTER — Ambulatory Visit: Payer: PPO | Admitting: Physician Assistant

## 2022-01-30 ENCOUNTER — Other Ambulatory Visit (INDEPENDENT_AMBULATORY_CARE_PROVIDER_SITE_OTHER): Payer: PPO

## 2022-01-30 ENCOUNTER — Other Ambulatory Visit: Payer: Self-pay

## 2022-01-30 ENCOUNTER — Encounter: Payer: Self-pay | Admitting: Physician Assistant

## 2022-01-30 VITALS — BP 110/66 | HR 80 | Ht 70.0 in | Wt 167.2 lb

## 2022-01-30 DIAGNOSIS — K7030 Alcoholic cirrhosis of liver without ascites: Secondary | ICD-10-CM

## 2022-01-30 DIAGNOSIS — R1319 Other dysphagia: Secondary | ICD-10-CM

## 2022-01-30 DIAGNOSIS — I8510 Secondary esophageal varices without bleeding: Secondary | ICD-10-CM

## 2022-01-30 DIAGNOSIS — E119 Type 2 diabetes mellitus without complications: Secondary | ICD-10-CM

## 2022-01-30 DIAGNOSIS — K7682 Hepatic encephalopathy: Secondary | ICD-10-CM

## 2022-01-30 DIAGNOSIS — Z794 Long term (current) use of insulin: Secondary | ICD-10-CM | POA: Diagnosis not present

## 2022-01-30 DIAGNOSIS — D128 Benign neoplasm of rectum: Secondary | ICD-10-CM | POA: Diagnosis not present

## 2022-01-30 LAB — COMPREHENSIVE METABOLIC PANEL
ALT: 38 U/L (ref 0–53)
AST: 55 U/L — ABNORMAL HIGH (ref 0–37)
Albumin: 4 g/dL (ref 3.5–5.2)
Alkaline Phosphatase: 72 U/L (ref 39–117)
BUN: 9 mg/dL (ref 6–23)
CO2: 26 mEq/L (ref 19–32)
Calcium: 9.8 mg/dL (ref 8.4–10.5)
Chloride: 103 mEq/L (ref 96–112)
Creatinine, Ser: 0.86 mg/dL (ref 0.40–1.50)
GFR: 99.31 mL/min (ref >60.00)
Glucose, Bld: 150 mg/dL — ABNORMAL HIGH (ref 70–99)
Potassium: 4.1 mEq/L (ref 3.5–5.1)
Sodium: 136 mEq/L (ref 135–145)
Total Bilirubin: 2.3 mg/dL — ABNORMAL HIGH (ref 0.2–1.2)
Total Protein: 7.7 g/dL (ref 6.0–8.3)

## 2022-01-30 LAB — CBC WITH DIFFERENTIAL/PLATELET
Basophils Absolute: 0 10*3/uL (ref 0.0–0.1)
Basophils Relative: 0.6 % (ref 0.0–3.0)
Eosinophils Absolute: 0.1 10*3/uL (ref 0.0–0.7)
Eosinophils Relative: 2.2 % (ref 0.0–5.0)
HCT: 32.5 % — ABNORMAL LOW (ref 39.0–52.0)
Hemoglobin: 11.1 g/dL — ABNORMAL LOW (ref 13.0–17.0)
Lymphocytes Relative: 27.5 % (ref 12.0–46.0)
Lymphs Abs: 1 10*3/uL (ref 0.7–4.0)
MCHC: 34.1 g/dL (ref 30.0–36.0)
MCV: 94.3 fl (ref 78.0–100.0)
Monocytes Absolute: 0.3 10*3/uL (ref 0.1–1.0)
Monocytes Relative: 8.7 % (ref 3.0–12.0)
Neutro Abs: 2.3 10*3/uL (ref 1.4–7.7)
Neutrophils Relative %: 61 % (ref 43.0–77.0)
Platelets: 134 10*3/uL — ABNORMAL LOW (ref 150.0–400.0)
RBC: 3.45 Mil/uL — ABNORMAL LOW (ref 4.22–5.81)
RDW: 15.7 % — ABNORMAL HIGH (ref 11.5–15.5)
WBC: 3.7 10*3/uL — ABNORMAL LOW (ref 4.0–10.5)

## 2022-01-30 LAB — AMMONIA: Ammonia: 188 umol/L — ABNORMAL HIGH (ref 11–35)

## 2022-01-30 LAB — PROTIME-INR
INR: 1.4 ratio — ABNORMAL HIGH (ref 0.8–1.0)
Prothrombin Time: 15 s — ABNORMAL HIGH (ref 9.6–13.1)

## 2022-01-30 LAB — MAGNESIUM: Magnesium: 2 mg/dL (ref 1.5–2.5)

## 2022-01-30 MED ORDER — RIFAXIMIN 550 MG PO TABS: 550.0000 mg | ORAL_TABLET | Freq: Two times a day (BID) | ORAL | 3 refills | Status: DC

## 2022-01-30 NOTE — Telephone Encounter (Signed)
Patient of Dr. Silverio Decamp seen in the office on 9-28 needs EGD at [facility] but Dr. Woodward Ku next available opening is not until November 28th. Per Vicie Mutters, PA, Dr. Silverio Decamp and Dr. Lorenso Courier, [place] to schedule with Dr. Lorenso Courier on Oct 19th at 10:30am.  Called and spoke to patient.  He is agreeable to procedure on Thursday, 10-19 with Dr. Lorenso Courier. Patient scheduled and informed instructions have been sent to his MyChart and will be mailed to him. He understands he will need driver, no solids after midnight and clears only until 6:30am. He understands he will need to arrive at [facility] at 9:00am on the 19th. All questions answered.

## 2022-01-30 NOTE — Patient Instructions (Signed)
Your provider has requested that you go to the basement level for lab work before leaving today. Press "B" on the elevator. The lab is located at the first door on the left as you exit the elevator.  Add on zinc 50 mg Add on xifaxin twice daily Continue lactulose 3 x a day Please stop drinking this can cause worsening confusion and worsening cirrhosis.  Would advise you to stop driving.   TYLENOL (ACETAMINOPHEN) IS SAFE IN LIVER DISEASE: You can take tylenol (acetaminophen) up to 2,000 mg/day. This would be four extra strength ('500mg'$ ) tablets over 24 hours OR six regular strength (325 mg) tablets over 24 hours. Please be sure to read the ingredients of over the counter medications and prescription pain medications as many contain acetaminophen.   NO NSAIDS (ibuprofen, advil, naproxen, aleve, motrin...)  HEPATIC ENCEPHALOPATHY HEPATIC ENCEPHALOPATHY: Confusion caused by a build up of toxins in the blood due to the liver not being able to filter toxins. This can cause confusion mild or severe, increase falls. If you have been diagnosed with Hepatic encephalopathy, advised to not drive due to increased risk   LACTULOSE:  helps pull ammonia and other toxins from the blood into your stool when you have a bowel movement NO NEED TO CHECK AMMONIA LEVEL IN BLOOD! Only need to check if you are having symptoms. 30-60m up to four times a day Take a dose in the morning If by lunch time you have not had AT LEAST 2 bowel movements take another dose If by dinner you have not had AT LEAST 2 bowel movements that day take another dose If by bedtime you still have not had 2 bowel movements take another dose Goal of 3-4 bowel movements per day Avoid taking with food as this will cause more gas  IF YOU ARE VERY SLEEPY, HARD FOR YOUR FAMILY TO WAKE YOU UP, OR FALLING ASLEEP DURING CONVERSATIONS -INCREASE LACTULOSE/GO TO THE EMERGENCY ROOM  XIFAXAN (rifaximin) An antibiotic that helps limit excess bacteria  in the intestine.  The bacteria can cause increased ammonia One '550mg'$  tablet twice daily  SUPPLEMENTS: multivitamin Zinc Branch Chain Amino acids (BCAA): 12 grams/day L-Ornithine L-Aspartate (LOLA): 6 grams three times/day ([email or web address]://cohen-[email or web address]/ L-Carnitine  PHYSICAL ACTIVITY It is important to continue to be active when you have cirrhosis. Exercise will help reduce muscle loss and weakness.  DISCUSS REFERRAL FOR PHYSICAL THERAPY WITH YOUR PRIMARY CARE PROVIDER  DIET/NUTRITION FOR CIRRHOSIS NO ALCOHOL YOUR GOALS Evening snack - high protein Supplements between meals to help meet calorie and protein goal: Boost Ensure Premier Protein Shakes Protein Greek yogurt Fish, chicken (NO RAW OR UNDERCOOKED FISH/SHELLFISH) Avoid pork and red meat Plant based protein (non-soy)/Vegan: Lentils, Chickpeas, Peanuts (non salted), almonds (non salted), quinoa, chia seeds  Plant based protein supplements (not soy)  Avoid/limit animal based protein supplements: whey, casein 4.  Low sodium (2,000 mg/day) A. Avoid: table salt, canned foods, deli meats, sausages, hot dogs, anything with a long shelf life B. Read nutrition labels and be aware of serving size. Don't go by percent of daily value.      Senior WCarpenterprovides rides for seniors age 2435and over who are ambulatory to medical appointments in [place] to regional medical facilities. Individuals must be ambulatory to receive rides through the [employer]  Volunteer drivers from the faith community provide the rides to scheduled medical appointments.  Contact the SeniorLine From [place] [phone]  From [place]: [phone] [email or web address] -resources-[email or web address]  https://www.senior-resources-[place].org/senior-wheels-medical-transportation-program  HomeFares & Passes HALF FARE with [employer] Qualify for  Half Fare PART provides a half-price discount to Seniors (60 plus), Disabled, Students, [employer], and [employer]. Seniors: Age 79+ with proper identification (driver's license, transit Economist)  Disabled: With disabled identification issued from [place] transit agency (We do not accept [employer])  Veterans Discount: With U.S. Department of Defense and [employer] retired Programmer, multimedia card, county USAA identification card, or [employer] with "[employer]" label.  Medicare: With valid medicare identification or transit Issued medicare identification    In order to get reduced fare when using [employer], you must be pre-registered in the system.  It is easy to become registered for your discount, simply fill out this form or visit the [facility] (CTC) ticket window and we will designate you as a discount fare passenger in the [place] system.  You won't need to show ID when boarding the bus.  Passes purchased through [place] prior to half fare registration/designation will NOT be refunded.  NuclearSuits.de  Non-Emergency Medical Transportation (TAMS)  TAMS provides transportation to doctors' offices, hospitals, dialysis centers, clinics, dentists and other health-related visits medical appoints for persons in need.  [facility] residents who have a Medicaid pink or blue card and who have no other means of transportation receive free transportation to medical appointment.  Transportation services may include bus tickets, gas vouchers, taxis or vans.  Medicaid recipients must complete a transportation assessment form before service can begin.  This is usually handled over the phone.  Other county residents living outside the city limits or without access to public transit can also receive medical transportation. Non-Medicaid passengers must also complete an eligibility form. Persons 37 years old or older  will be mailed and form.  For all other the form can be downloaded below. Persons over 43 years of age may receive service for free, all others pay a $2.50 one-way fare.  Download form FOR NON_MEDICAID RECEIPTANTS AND PERSON 68 YEARS OLD OR YOUNGER ONLY. ALL OTHERS MUST CALL [phone].   [email or web address]  _________________________________________________________________ Dennis Bast have been scheduled for an abdominal ultrasound at [facility] (1st floor of hospital) tomorrow,  Friday, 01-31-22 at 11:30 am. Please arrive 30 minutes prior to your appointment for registration. Make certain not to have anything to eat or drink 6 hours prior to your appointment. Should you need to reschedule your appointment, please contact radiology at [phone]. This test typically takes about 30 minutes to perform.  Thank you for entrusting me with your care and for choosing [facility], Vicie Mutters, P.A.-C

## 2022-01-30 NOTE — Progress Notes (Signed)
Patient of Dr. Woodward Ku scheduled for EGD at [facility] on Thursday,  10-19 at 10:30am with Dr. Lorenso Courier (cirrhosis, Esophageal varices, dysphagia)

## 2022-01-31 ENCOUNTER — Ambulatory Visit (HOSPITAL_COMMUNITY)
Admission: RE | Admit: 2022-01-31 | Discharge: 2022-01-31 | Disposition: A | Discharge status: Home / Self Care | Payer: PPO | Source: Ambulatory Visit | Attending: Physician Assistant | Admitting: Physician Assistant

## 2022-01-31 DIAGNOSIS — K7030 Alcoholic cirrhosis of liver without ascites: Secondary | ICD-10-CM | POA: Insufficient documentation

## 2022-01-31 DIAGNOSIS — K8020 Calculus of gallbladder without cholecystitis without obstruction: Secondary | ICD-10-CM | POA: Diagnosis not present

## 2022-01-31 LAB — AFP TUMOR MARKER: AFP-Tumor Marker: 10.4 ng/mL — ABNORMAL HIGH (ref <6.1)

## 2022-01-31 NOTE — Telephone Encounter (Signed)
Thank you :)

## 2022-02-05 ENCOUNTER — Other Ambulatory Visit: Payer: Self-pay | Admitting: Psychiatry

## 2022-02-05 DIAGNOSIS — K7682 Hepatic encephalopathy: Secondary | ICD-10-CM | POA: Diagnosis not present

## 2022-02-05 DIAGNOSIS — I10 Essential (primary) hypertension: Secondary | ICD-10-CM | POA: Diagnosis not present

## 2022-02-05 DIAGNOSIS — E669 Obesity, unspecified: Secondary | ICD-10-CM | POA: Diagnosis not present

## 2022-02-05 DIAGNOSIS — F250 Schizoaffective disorder, bipolar type: Secondary | ICD-10-CM

## 2022-02-05 DIAGNOSIS — D696 Thrombocytopenia, unspecified: Secondary | ICD-10-CM | POA: Diagnosis not present

## 2022-02-05 DIAGNOSIS — F1020 Alcohol dependence, uncomplicated: Secondary | ICD-10-CM | POA: Diagnosis not present

## 2022-02-05 DIAGNOSIS — E1149 Type 2 diabetes mellitus with other diabetic neurological complication: Secondary | ICD-10-CM | POA: Diagnosis not present

## 2022-02-05 DIAGNOSIS — E039 Hypothyroidism, unspecified: Secondary | ICD-10-CM | POA: Diagnosis not present

## 2022-02-05 DIAGNOSIS — K7030 Alcoholic cirrhosis of liver without ascites: Secondary | ICD-10-CM | POA: Diagnosis not present

## 2022-02-05 DIAGNOSIS — F319 Bipolar disorder, unspecified: Secondary | ICD-10-CM | POA: Diagnosis not present

## 2022-02-05 DIAGNOSIS — E785 Hyperlipidemia, unspecified: Secondary | ICD-10-CM | POA: Diagnosis not present

## 2022-02-05 DIAGNOSIS — E876 Hypokalemia: Secondary | ICD-10-CM | POA: Diagnosis not present

## 2022-02-05 DIAGNOSIS — I8510 Secondary esophageal varices without bleeding: Secondary | ICD-10-CM | POA: Diagnosis not present

## 2022-02-05 NOTE — Telephone Encounter (Signed)
Please call to schedule appt.  Previous appt was canceled due to provider schedule.

## 2022-02-06 NOTE — Telephone Encounter (Signed)
Pt is scheduled 05/08/22

## 2022-02-11 ENCOUNTER — Ambulatory Visit: Payer: PPO | Admitting: Psychiatry

## 2022-02-13 ENCOUNTER — Telehealth: Payer: Self-pay | Admitting: Physician Assistant

## 2022-02-13 ENCOUNTER — Encounter (HOSPITAL_COMMUNITY): Payer: Self-pay | Admitting: Internal Medicine

## 2022-02-13 NOTE — Telephone Encounter (Signed)
Inbound call from patient requesting to speak with a nurse , regarding up coming procedure , states he have couple of question that he want to address.Please advise

## 2022-02-14 NOTE — Telephone Encounter (Signed)
Spoke with patient & advised that he may have received a call from [facility] regarding upcoming procedure, but no one from our office had tried to contact him that I'm aware of. Pt had no further questions in regards to procedure.

## 2022-02-14 NOTE — Telephone Encounter (Signed)
Called patient back. LVM to call us with any questions or concerns.

## 2022-02-17 ENCOUNTER — Telehealth: Payer: Self-pay

## 2022-02-17 NOTE — Telephone Encounter (Signed)
Patient is on the cancellation list for hospital procedures with Dr Silverio Decamp.

## 2022-02-17 NOTE — Telephone Encounter (Signed)
Patient calls to cancel his EGD with varices banding that is scheduled with Miguel Lorenso Courier on 02/20/22 at [facility]. He tells me is he is not feeling well. He would like to postpone until December. Further reports he is not having any difficulty swallowing and "can eat salads and other things I fix." Miguel Guerra is a patient of Miguel Guerra who was seen by Vicie Mutters, PA. Miguel Lorenso Courier had agreed to do his EGD because she had an opening and could accommodate him quickly.  His hospital case has been cancelled per patient request.

## 2022-02-18 NOTE — Telephone Encounter (Signed)
Spoke with the patient. Agrees to an office visit with Dr Silverio Decamp 04/14/22 at 9:50 am. I will call him if an appointment become available before this date.

## 2022-02-20 ENCOUNTER — Ambulatory Visit (HOSPITAL_COMMUNITY): Admission: RE | Admit: 2022-02-20 | Payer: PPO | Source: Home / Self Care | Admitting: Internal Medicine

## 2022-02-20 SURGERY — ESOPHAGOGASTRODUODENOSCOPY (EGD) WITH PROPOFOL
Anesthesia: Monitor Anesthesia Care

## 2022-02-26 ENCOUNTER — Other Ambulatory Visit: Payer: Self-pay | Admitting: Gastroenterology

## 2022-03-17 DIAGNOSIS — Z1152 Encounter for screening for COVID-19: Secondary | ICD-10-CM | POA: Diagnosis not present

## 2022-03-17 DIAGNOSIS — R509 Fever, unspecified: Secondary | ICD-10-CM | POA: Diagnosis not present

## 2022-03-17 DIAGNOSIS — R5383 Other fatigue: Secondary | ICD-10-CM | POA: Diagnosis not present

## 2022-03-17 DIAGNOSIS — H6591 Unspecified nonsuppurative otitis media, right ear: Secondary | ICD-10-CM | POA: Diagnosis not present

## 2022-03-17 DIAGNOSIS — R0981 Nasal congestion: Secondary | ICD-10-CM | POA: Diagnosis not present

## 2022-04-14 ENCOUNTER — Ambulatory Visit (INDEPENDENT_AMBULATORY_CARE_PROVIDER_SITE_OTHER): Payer: PPO | Admitting: Gastroenterology

## 2022-04-14 ENCOUNTER — Other Ambulatory Visit (INDEPENDENT_AMBULATORY_CARE_PROVIDER_SITE_OTHER): Payer: PPO

## 2022-04-14 ENCOUNTER — Encounter: Payer: Self-pay | Admitting: Gastroenterology

## 2022-04-14 VITALS — BP 120/68 | HR 85 | Ht 70.0 in | Wt 170.0 lb

## 2022-04-14 DIAGNOSIS — K7031 Alcoholic cirrhosis of liver with ascites: Secondary | ICD-10-CM

## 2022-04-14 DIAGNOSIS — K7030 Alcoholic cirrhosis of liver without ascites: Secondary | ICD-10-CM

## 2022-04-14 DIAGNOSIS — I8510 Secondary esophageal varices without bleeding: Secondary | ICD-10-CM

## 2022-04-14 DIAGNOSIS — K7682 Hepatic encephalopathy: Secondary | ICD-10-CM

## 2022-04-14 LAB — CBC WITH DIFFERENTIAL/PLATELET
Basophils Absolute: 0 10*3/uL (ref 0.0–0.1)
Basophils Relative: 0.9 % (ref 0.0–3.0)
Eosinophils Absolute: 0.1 10*3/uL (ref 0.0–0.7)
Eosinophils Relative: 2.5 % (ref 0.0–5.0)
HCT: 30.1 % — ABNORMAL LOW (ref 39.0–52.0)
Hemoglobin: 10.3 g/dL — ABNORMAL LOW (ref 13.0–17.0)
Lymphocytes Relative: 27.1 % (ref 12.0–46.0)
Lymphs Abs: 0.9 10*3/uL (ref 0.7–4.0)
MCHC: 34.1 g/dL (ref 30.0–36.0)
MCV: 89.5 fl (ref 78.0–100.0)
Monocytes Absolute: 0.3 10*3/uL (ref 0.1–1.0)
Monocytes Relative: 10 % (ref 3.0–12.0)
Neutro Abs: 1.9 10*3/uL (ref 1.4–7.7)
Neutrophils Relative %: 59.5 % (ref 43.0–77.0)
Platelets: 134 10*3/uL — ABNORMAL LOW (ref 150.0–400.0)
RBC: 3.36 Mil/uL — ABNORMAL LOW (ref 4.22–5.81)
RDW: 14.9 % (ref 11.5–15.5)
WBC: 3.2 10*3/uL — ABNORMAL LOW (ref 4.0–10.5)

## 2022-04-14 LAB — COMPREHENSIVE METABOLIC PANEL
ALT: 31 U/L (ref 0–53)
AST: 65 U/L — ABNORMAL HIGH (ref 0–37)
Albumin: 3.8 g/dL (ref 3.5–5.2)
Alkaline Phosphatase: 63 U/L (ref 39–117)
BUN: 12 mg/dL (ref 6–23)
CO2: 25 mEq/L (ref 19–32)
Calcium: 9.5 mg/dL (ref 8.4–10.5)
Chloride: 106 mEq/L (ref 96–112)
Creatinine, Ser: 0.69 mg/dL (ref 0.40–1.50)
GFR: 105.99 mL/min (ref >60.00)
Glucose, Bld: 129 mg/dL — ABNORMAL HIGH (ref 70–99)
Potassium: 3.9 mEq/L (ref 3.5–5.1)
Sodium: 140 mEq/L (ref 135–145)
Total Bilirubin: 2.4 mg/dL — ABNORMAL HIGH (ref 0.2–1.2)
Total Protein: 7.3 g/dL (ref 6.0–8.3)

## 2022-04-14 LAB — PROTIME-INR
INR: 1.4 ratio — ABNORMAL HIGH (ref 0.8–1.0)
Prothrombin Time: 15.2 s — ABNORMAL HIGH (ref 9.6–13.1)

## 2022-04-14 MED ORDER — NADOLOL 20 MG PO TABS: 20.0000 mg | ORAL_TABLET | Freq: Every day | ORAL | 3 refills | Status: DC

## 2022-04-14 NOTE — Patient Instructions (Signed)
If you are age 53 or older, your body mass index should be between 23-30. Your Body mass index is 24.39 kg/m. If this is out of the aforementioned range listed, please consider follow up with your Primary Care Provider.  If you are age 39 or younger, your body mass index should be between 19-25. Your Body mass index is 24.39 kg/m. If this is out of the aformentioned range listed, please consider follow up with your Primary Care Provider.   ________________________________________________________  Please go to the lab in the basement of our building to have lab work done as you leave today. Hit "B" for basement when you get on the elevator.  When the doors open the lab is on your left.  We will call you with the results. Thank you.  We have sent the following medications to your pharmacy for you to pick up at your convenience: Nadolol 20 mg: take once daily  Continue lactulose  Avoid alcohol.  You have been scheduled for a follow up appointment with Nicoletta Ba, PA on Monday, 05-19-22 at 9:00am. Please arrive 10 minutes early for registration.    I appreciate the opportunity to care for you. Thank you for choosing me and [facility],  Dr. Harl Bowie

## 2022-04-14 NOTE — Progress Notes (Signed)
Miguel Guerra    [id number]    [birth year]  Primary Care Physician:Shaw, Emily Filbert., MD  Referring Physician: Ginger Organ., MD [address] [place],  [place] [address]   Chief complaint:  cirrhosis  HPI:  51 yr M is here for follow up for cirrhosis.  Denies any nausea, vomiting, abdominal pain, melena or bright red blood per rectum  He is taking Lactulose and on average has 3-4 soft BM per day, denies any changes in sleep pattern or confusion  He is not taking Nadolol  No unintentional weight gain or abdominal distension.  He continues to drink wine daily, per patient 1-2 drinks in the evening.  EGD: 03/08/22 - Grade II esophageal varices. - Z-line regular, 36 cm from the incisors. - Portal hypertensive gastropathy. - Friable gastric mucosa. Biopsied. - Normal examined duodenum.  Colonoscopy 03/08/22 - One 22 mm polyp in the rectum, removed with a hot snare. Resected and retrieved. Clip (MR conditional) was placed. - Diverticulosis in the sigmoid colon, in the descending colon and in the ascending colon. - Non-bleeding external and internal hemorrhoids.  Outpatient Encounter Medications as of 04/14/2022  Medication Sig   acetaminophen (TYLENOL) 500 MG tablet Take 500-1,000 mg by mouth every 6 (six) hours as needed (pain.).   ALPRAZolam (XANAX) 0.5 MG tablet Take 1 tablet (0.5 mg total) by mouth 2 (two) times daily as needed for anxiety.   atorvastatin (LIPITOR) 40 MG tablet Take 40 mg by mouth in the morning.   insulin NPH-regular Human (70-30) 100 UNIT/ML injection Inject 25 Units into the skin 2 (two) times daily with a meal.   lactulose (CHRONULAC) 10 GM/15ML solution Take 20 g by mouth 3 (three) times daily.   levothyroxine (SYNTHROID) 50 MCG tablet Take 50 mcg by mouth daily before breakfast.   lisinopril (PRINIVIL,ZESTRIL) 10 MG tablet Take 10 mg by mouth in the morning.   metFORMIN (GLUCOPHAGE) 500 MG tablet Take 500 mg by mouth 2 (two)  times daily.   nadolol (CORGARD) 20 MG tablet TAKE 1 TABLET BY MOUTH EVERY DAY   OLANZapine (ZYPREXA) 20 MG tablet TAKE 1 TABLET BY MOUTH EVERYDAY AT BEDTIME   omeprazole (PRILOSEC) 40 MG capsule TAKE 1 CAPSULE (40 MG TOTAL) BY MOUTH DAILY.   rifaximin (XIFAXAN) 550 MG TABS tablet Take 1 tablet (550 mg total) by mouth 2 (two) times daily.   No facility-administered encounter medications on file as of 04/14/2022.    Allergies as of 04/14/2022 - Review Complete 04/14/2022  Allergen Reaction Noted   Penicillins Other (See Comments)     Past Medical History:  Diagnosis Date   Anxiety    Bipolar 1 disorder ([place])    Cirrhosis of liver (HCC)    Diabetes mellitus without complication (HCC)    off medication x 1 month   Heartburn    Hematuria    History of alcohol abuse    Hyperlipemia    Hypertension    OCD (obsessive compulsive disorder)    Other symptoms involving urinary system(788.99)    Tourette disorder     Past Surgical History:  Procedure Laterality Date   MULTIPLE TOOTH EXTRACTIONS  2011    Family History  Problem Relation Age of Onset   Breast cancer Mother    OCD Mother    Skin cancer Father    Prostate cancer Father    Pancreatic cancer Paternal Grandfather    Colon cancer Neg Hx  Esophageal cancer Neg Hx    Stomach cancer Neg Hx     Social History   Socioeconomic History   Marital status: Single    Spouse name: Not on file   Number of children: 0   Years of education: college   Highest education level: Not on file  Occupational History   Occupation: umemployed  Tobacco Use   Smoking status: Former    Types: Cigarettes   Smokeless tobacco: Never  Vaping Use   Vaping Use: Never used  Substance and Sexual Activity   Alcohol use: Not Currently    Comment: 1 drink a day (01-25-22)   Drug use: No   Sexual activity: Not on file  Other Topics Concern   Not on file  Social History Narrative   Not on file   Social Determinants of Health    Financial Resource Strain: Not on file  Food Insecurity: Not on file  Transportation Needs: Not on file  Physical Activity: Not on file  Stress: Not on file  Social Connections: Not on file  Intimate Partner Violence: Not on file      Review of systems: All other review of systems negative except as mentioned in the HPI.   Physical Exam: Vitals:   04/14/22 0944  BP: 120/68  Pulse: 85  SpO2: 98%   Body mass index is 24.39 kg/m. Gen:      No acute distress HEENT:  sclera anicteric Abd:      soft, non-tender; no palpable masses, + distension with ascites Ext:    No edema Neuro: alert and oriented x 3, no asterixis Psych: normal mood and affect  Data Reviewed:  Reviewed labs, radiology imaging, old records and pertinent past GI work up   Assessment and Plan/Recommendations:  51 year old male with decompensated alcoholic cirrhosis with hepatic encephalopathy and ascites  MELD 3.0: 13 at 04/14/2022 10:27 AM MELD-Na: 14 at 04/14/2022 10:27 AM  Follow-up CBC, CMP, PT and INR Will plan to start low-dose diuretics based on renal function electrolytes, has ascites based on abdominal exam  Hepatic encephalopathy: Continue lactulose with goal 3 bowel movements per day  Esophageal varices: Start nadolol 20 mg daily for bleeding prophylaxis  Mild elevation AFP, no liver lesions on ultrasound, will follow-up AFP if continues to rise will obtain MRI liver to exclude HCC  Discussed alcohol cessation  Return in 4 to 6 weeks   This visit required >40 minutes of patient care (this includes precharting, chart review, review of results, face-to-face time used for counseling as well as treatment plan and follow-up. The patient was provided an opportunity to ask questions and all were answered. The patient agreed with the plan and demonstrated an understanding of the instructions.  Damaris Hippo , MD    CC: Ginger Organ., MD

## 2022-04-15 LAB — AFP TUMOR MARKER: AFP-Tumor Marker: 8.5 ng/mL — ABNORMAL HIGH (ref <6.1)

## 2022-05-02 ENCOUNTER — Other Ambulatory Visit: Payer: Self-pay | Admitting: Psychiatry

## 2022-05-02 DIAGNOSIS — F4001 Agoraphobia with panic disorder: Secondary | ICD-10-CM

## 2022-05-07 DIAGNOSIS — E785 Hyperlipidemia, unspecified: Secondary | ICD-10-CM | POA: Diagnosis not present

## 2022-05-07 DIAGNOSIS — E1149 Type 2 diabetes mellitus with other diabetic neurological complication: Secondary | ICD-10-CM | POA: Diagnosis not present

## 2022-05-07 DIAGNOSIS — I10 Essential (primary) hypertension: Secondary | ICD-10-CM | POA: Diagnosis not present

## 2022-05-07 DIAGNOSIS — Z794 Long term (current) use of insulin: Secondary | ICD-10-CM | POA: Diagnosis not present

## 2022-05-08 ENCOUNTER — Ambulatory Visit: Payer: PPO | Admitting: Psychiatry

## 2022-05-08 ENCOUNTER — Encounter: Payer: Self-pay | Admitting: Psychiatry

## 2022-05-08 DIAGNOSIS — F4001 Agoraphobia with panic disorder: Secondary | ICD-10-CM | POA: Diagnosis not present

## 2022-05-08 DIAGNOSIS — F952 Tourette's disorder: Secondary | ICD-10-CM

## 2022-05-08 DIAGNOSIS — F4010 Social phobia, unspecified: Secondary | ICD-10-CM | POA: Diagnosis not present

## 2022-05-08 DIAGNOSIS — F250 Schizoaffective disorder, bipolar type: Secondary | ICD-10-CM

## 2022-05-08 MED ORDER — OLANZAPINE 20 MG PO TABS: 20.0000 mg | ORAL_TABLET | Freq: Every day | ORAL | 2 refills | Status: DC

## 2022-05-08 MED ORDER — ALPRAZOLAM 0.5 MG PO TABS: 0.5000 mg | ORAL_TABLET | Freq: Two times a day (BID) | ORAL | 5 refills | Status: DC | PRN

## 2022-05-08 NOTE — Progress Notes (Signed)
Miguel Guerra [id number] 05/06/1970 52 y.o.  Subjective:   Patient ID:  Miguel Guerra is a 52 y.o. (DOB 1971/04/17) male.  Chief Complaint:  Chief Complaint  Patient presents with   Follow-up    Schizoaffective disorder, bipolar type ([place])   Anxiety    Anxiety Symptoms include nervous/anxious behavior. Patient reports no chest pain, confusion, decreased concentration, palpitations or suicidal ideas.     Miguel Guerra presents to the office today for follow-up of  tourette's disorder and psychosis.  02/2019 appt with the following noted: consistent with meds.  Status quo with sx as noted below.   Orap helps with tics but doesn't eliminate them.  It's awkward socially some but he doesn't have friends or go out in public much anyway bc of Covid.  I notice it all the time but has had facial and head tics since childhood.  Neurologist originally Rx the Orap and then PCP took it over.   Off and on visual hallucinations of people, bugs and animals and sometimes scary.  Still gets depressed but at baseline.  Satisfied with meds. Labs with PCP. Hyponatremia dt excessive water.  Stopped beer.  Not abusing alcohol.  Quit smoking cigarettes after smoking 2ppd for 16 years.  Already feels better. Overall doing ok with good days and bad days but stable compared to before. Sleep pretty good with meds.  Still sees shower people in the shower, 5 different people.  Can't explain that one.  Also sees images of people in other locations too.  Can see people walk in his house.  They are not always clear.  Not hearing voices for awhile.  Last AH a couple of mos ago.  May hear voices that wake him from sleep.  Sleeping better off cigarettes.  At first it scared him, but has gottten more used to it.   Occ feels watched.  No recent voices and hallucinations no longer waking him.  Not lasting depression.  Does have anxiety including social and panic.  Panic attacks like a heart attack and are scary.  Has had to  leave store DT panic before.  Tries to avoid people.  Does feel paranoid around people, feels they watch him.  Usually still going to gym now and enjoys the progress he sees. Quit drinking.  Some weight gain. Quit smoking and caffeine. Only taking 0.5 mg Xanax daily. Not abusing.  Can still have panic in public. Pending MRI head bc slurred speech and can't write anymore and hard to read.  ? Ministroke.  Had speech therapy as akid. Plan: No medication changes indicated except stop risperidone bc using Orap for tics now.    01/25/20 appt with the following noted: Had 2 weeks straight of seeing a person in front of him, usually a man, but then it went away.  Happened at home and parents' home.  It did not talk.  Frightens him when it happens.  You'd think I'd be used to it.  occ hears voices but overall feels psychotic sx are manageable. Stopped Orap bc of money. Sleeps well with olanzapine.   No beer in 8 mos.  Occ glass of wine at dinner.  Lost 20# off alcohol.  Also on a diet.  Never drank liquor.   Takes Xanax every AM for anxiety.   Tics involve oral and facial and neck movements and masks help hide this so is ok off Orap which he couldn't afford. He may want to try meds later. Plan: Continue olanzapine 20 mg HS  He's not sure but thinks he stopped the lamotrigine.   07/23/2020 appointment with the following noted: Hanging in there.  About the same.  Still see things in the house but handles it.  Day or night can feel something is looking at me.  Turns around and sees it briefly and it dissipates.  Not talking to him.  Nothing too scary.   Anxiety managed with Xanax AM and rarely thereafter.   Occ depression but not all the time. No problems with olanzapine which helps him sleep.  Taking meds for DM.  Had DM for 6-7 years he thinks. Plan: Continue olanzapine 20 mg HS  And alprazolam.    01/24/21 appt noted: Good and bad days.  Excellent until 2-3 weeks ago started seeing people in the house  staring at him after waking from sleep and once awake a while then they go away.  But does scare him.  3-4 mos ago nothing was happenining.  Sleep unchanged and good. Takes about Xanax 0.5 mg per day/  people can make him anxious.  It does help but not abused. Stress money but mother helps him out.  Her health is good.  F NHP for dementia. No SE meds.  Depression managed. Tics come and go and doesn't like them but can't afford Orap.  Might get from [place]. Plan: continue alprazolam and olanzapine 20 mg HS  08/12/21 appt noted: Takes Xanax 0.5 mg mainly in am.  Consistent with olanzapine 20 mg HS. Still sees people in his house or parents house.  Afraid to go in parents house alone bc seen demonic things there.  Not afraid at his house but seen people talking to each other. No SE meds.  Depression managed. Tics come and go and doesn't like them but can't afford Orap.  Plan: Continue olanzapine 20 mg HS  And alprazolam.   05/08/21 appt noted: Doing OK overall.  Man named Miguel Guerra living with him for 2 mos.  It's a hallucination of a man.  Maybe it's all in my head.  Did not feel afraid.  They didn't talk much.   Consistent with olanzapine every morning and doesn't make him that sleepy.  Sleeps with the alprazolam 0.5 mg HS.   No SE with  meds. Some confusion.   M moved to [facility] and he's alone in [place].   Does not socialize much. Goes to grocery store.   No anger.  Lonely. Single all his life.  No best friend but a few friends.  Patient on disability  Past Psychiatric Medication Trials: risperidone, olanzapine 20, Orap $, lamotrigine  3 [place].  He does not want to drive to the beach.    Review of Systems:  Review of Systems  Cardiovascular:  Negative for chest pain and palpitations.  Gastrointestinal:  Negative for abdominal distention and diarrhea.  Neurological:  Negative for tremors.  Psychiatric/Behavioral:  Positive for hallucinations. Negative for  agitation, behavioral problems, confusion, decreased concentration, dysphoric mood, self-injury, sleep disturbance and suicidal ideas. The patient is nervous/anxious. The patient is not hyperactive.     Medications: I have reviewed the patient's current medications.  Current Outpatient Medications  Medication Sig Dispense Refill   acetaminophen (TYLENOL) 500 MG tablet Take 500-1,000 mg by mouth every 6 (six) hours as needed (pain.).     ALPRAZolam (XANAX) 0.5 MG tablet TAKE 1 TABLET BY MOUTH 2 TIMES DAILY AS NEEDED FOR ANXIETY. 60 tablet 0   atorvastatin (LIPITOR)  40 MG tablet Take 40 mg by mouth in the morning.     insulin NPH-regular Human (70-30) 100 UNIT/ML injection Inject 25 Units into the skin 2 (two) times daily with a meal.     lactulose (CHRONULAC) 10 GM/15ML solution Take 20 g by mouth 3 (three) times daily.     levothyroxine (SYNTHROID) 50 MCG tablet Take 50 mcg by mouth daily before breakfast.     lisinopril (PRINIVIL,ZESTRIL) 10 MG tablet Take 10 mg by mouth in the morning.     metFORMIN (GLUCOPHAGE) 500 MG tablet Take 500 mg by mouth 2 (two) times daily.     nadolol (CORGARD) 20 MG tablet Take 1 tablet (20 mg total) by mouth daily. 30 tablet 3   OLANZapine (ZYPREXA) 20 MG tablet TAKE 1 TABLET BY MOUTH EVERYDAY AT BEDTIME 90 tablet 1   omeprazole (PRILOSEC) 40 MG capsule TAKE 1 CAPSULE (40 MG TOTAL) BY MOUTH DAILY. 90 capsule 1   rifaximin (XIFAXAN) 550 MG TABS tablet Take 1 tablet (550 mg total) by mouth 2 (two) times daily. 60 tablet 3   No current facility-administered medications for this visit.    Medication Side Effects: Other: dry mouth  Allergies:  Allergies  Allergen Reactions   Penicillins Other (See Comments)    swelling of Neck    Past Medical History:  Diagnosis Date   Anxiety    Bipolar 1 disorder (HCC)    Cirrhosis of liver (HCC)    Diabetes mellitus without complication (HCC)    off medication x 1 month   Heartburn    Hematuria    History of  alcohol abuse    Hyperlipemia    Hypertension    OCD (obsessive compulsive disorder)    Other symptoms involving urinary system(788.99)    Tourette disorder     Family History  Problem Relation Age of Onset   Breast cancer Mother    OCD Mother    Skin cancer Father    Prostate cancer Father    Pancreatic cancer Paternal Grandfather    Colon cancer Neg Hx    Esophageal cancer Neg Hx    Stomach cancer Neg Hx     Social History   Socioeconomic History   Marital status: Single    Spouse name: Not on file   Number of children: 0   Years of education: college   Highest education level: Not on file  Occupational History   Occupation: umemployed  Tobacco Use   Smoking status: Former    Types: Cigarettes   Smokeless tobacco: Never  Vaping Use   Vaping Use: Never used  Substance and Sexual Activity   Alcohol use: Not Currently    Comment: 1 drink a day (01-25-22)   Drug use: No   Sexual activity: Not on file  Other Topics Concern   Not on file  Social History Narrative   Not on file   Social Determinants of Health   Financial Resource Strain: Not on file  Food Insecurity: Not on file  Transportation Needs: Not on file  Physical Activity: Not on file  Stress: Not on file  Social Connections: Not on file  Intimate Partner Violence: Not on file    Past Medical History, Surgical history, Social history, and Family history were reviewed and updated as appropriate.   Please see review of systems for further details on the patient's review from today.   Objective:   Physical Exam:  There were no vitals taken for this visit.  Physical  Exam Constitutional:      General: He is not in acute distress. Musculoskeletal:        General: No deformity.  Neurological:     Mental Status: He is alert and oriented to person, place, and time.     Cranial Nerves: No dysarthria.     Coordination: Coordination normal.  Psychiatric:        Attention and Perception: Attention  normal. He perceives visual hallucinations. He does not perceive auditory hallucinations.        Mood and Affect: Mood is anxious. Mood is not depressed. Affect is not labile, blunt, angry or inappropriate.        Speech: Speech normal.        Behavior: Behavior normal. Behavior is cooperative.        Thought Content: Thought content is not paranoid or delusional. Thought content does not include homicidal or suicidal ideation. Thought content does not include suicidal plan.        Cognition and Memory: Cognition and memory normal.        Judgment: Judgment normal.     Comments: Insight fair.  Hallucinations are maybe a little worse but he's not bothered.   No AIM     Lab Review:     Component Value Date/Time   NA 140 04/14/2022 1027   NA 137 02/22/2014 0834   K 3.9 04/14/2022 1027   CL 106 04/14/2022 1027   CO2 25 04/14/2022 1027   GLUCOSE 129 (H) 04/14/2022 1027   BUN 12 04/14/2022 1027   BUN 14 02/22/2014 0834   CREATININE 0.69 04/14/2022 1027   CALCIUM 9.5 04/14/2022 1027   PROT 7.3 04/14/2022 1027   PROT 7.1 02/22/2014 0834   ALBUMIN 3.8 04/14/2022 1027   ALBUMIN 4.7 02/22/2014 0834   AST 65 (H) 04/14/2022 1027   ALT 31 04/14/2022 1027   ALKPHOS 63 04/14/2022 1027   BILITOT 2.4 (H) 04/14/2022 1027   GFRNONAA 79 02/22/2014 0834   GFRAA 92 02/22/2014 0834       Component Value Date/Time   WBC 3.2 (L) 04/14/2022 1027   RBC 3.36 (L) 04/14/2022 1027   HGB 10.3 (L) 04/14/2022 1027   HCT 30.1 (L) 04/14/2022 1027   PLT 134.0 (L) 04/14/2022 1027   MCV 89.5 04/14/2022 1027   MCHC 34.1 04/14/2022 1027   RDW 14.9 04/14/2022 1027   LYMPHSABS 0.9 04/14/2022 1027   MONOABS 0.3 04/14/2022 1027   EOSABS 0.1 04/14/2022 1027   BASOSABS 0.0 04/14/2022 1027    No results found for: "POCLITH", "LITHIUM"   Lab Results  Component Value Date   VALPROATE 55 02/22/2014     .res Assessment: Plan:    Schizoaffective disorder, bipolar type ([place])  Panic disorder with  agoraphobia  Social anxiety disorder  Tourette's disorder   Greater than 50% of 30-minute phone to phone with patient was spent on counseling and coordination of care. We discussed his diagnoses above and chronic psychosis with anxiety. Chronic visual hallucinations scary at night but no worse and limited AH at this time.  Overall he appears to be at baseline.  Regarding his chronic auditory hallucinations they are improved but he has still has chronic visual hallucinations of an unusual sort.  They are not improving with higher doses of antipsychotic.  I am reluctant to go higher as I do not expect we are likely to see resolution.  We could consider clozapine but it is a much more difficult medicine to use requiring the  weekly CBC due to the risk of aplastic anemia.  He is aware of that option but wishes to defer at this time.  Discussed potential metabolic side effects associated with atypical antipsychotics, as well as potential risk for movement side effects. Advised pt to contact office if movement side effects occur.  Discussed the risk of using multiple antipsychotics but he does appear to be tolerating them well at this time.  The Zyprexa to help with hallucinations  Continue olanzapine 20 mg HS  And alprazolam.  No abuse apparent.  No med changes indicated  We discussed the short-term risks associated with benzodiazepines including sedation and increased fall risk among others.  Discussed long-term side effect risk including dependence, potential withdrawal symptoms, and the potential eventual dose-related risk of dementia.  But recent studies from 2020 dispute this association between benzodiazepines and dementia risk. Newer studies in 2020 do not support an association with dementia.  Tics are tolerable off the risperidone and Orap. He feels it's manageable at present.  He may see another neuro bc can't afford Orap.  We discussed discussed his social anxiety and panic in public.  I  am reluctant to add more medications at this particular time but we may consider this in the future.   Follow-up 6 months  Lynder Parents MD, DFAPA    Please see After Visit Summary for patient specific instructions.  Future Appointments  Date Time Provider [place]  05/19/2022  9:00 AM E[place], Amy S, PA-C [facility]     No orders of the defined types were placed in this encounter.     -------------------------------

## 2022-05-19 ENCOUNTER — Ambulatory Visit: Payer: PPO | Admitting: Physician Assistant

## 2022-05-21 ENCOUNTER — Telehealth: Payer: Self-pay

## 2022-05-21 NOTE — Telephone Encounter (Signed)
-----  Message from Roetta Sessions, [place] sent at 04/14/2022 10:26 AM EST ----- Regarding: sch pt for F/U appt with Dr. Silverio Decamp in mid March sch pt for F/U appt with Dr. Silverio Decamp in mid March  Patient has appointment with Amy in Jan.  Add appointment with Nandigam to notes so patient will be made aware

## 2022-05-21 NOTE — Telephone Encounter (Signed)
Called and spoke to patient. Scheduled him for a follow up appointment with Dr. Silverio Decamp on 2-22.  His appointment with Amy was cancelled prior to call

## 2022-06-02 ENCOUNTER — Telehealth: Payer: Self-pay | Admitting: Psychiatry

## 2022-06-02 NOTE — Telephone Encounter (Signed)
Mom,, Joy, called because she has moved away from our area.  She has encouraged Everardo to take a job.  But she doesn't want to be encouraging him to do something that he can't do and would be detrimental to him.  She would like to discuss this with you. (We do have an ROI)  Please call [phone].  His next appt is 02/10/2023

## 2022-06-04 NOTE — Telephone Encounter (Signed)
Unable to reach.

## 2022-06-26 ENCOUNTER — Ambulatory Visit (INDEPENDENT_AMBULATORY_CARE_PROVIDER_SITE_OTHER): Payer: PPO | Admitting: Gastroenterology

## 2022-06-26 ENCOUNTER — Encounter: Payer: Self-pay | Admitting: Gastroenterology

## 2022-06-26 ENCOUNTER — Other Ambulatory Visit (INDEPENDENT_AMBULATORY_CARE_PROVIDER_SITE_OTHER): Payer: PPO

## 2022-06-26 VITALS — Ht 70.0 in | Wt 176.4 lb

## 2022-06-26 DIAGNOSIS — K7031 Alcoholic cirrhosis of liver with ascites: Secondary | ICD-10-CM

## 2022-06-26 DIAGNOSIS — I8510 Secondary esophageal varices without bleeding: Secondary | ICD-10-CM

## 2022-06-26 DIAGNOSIS — Z86010 Personal history of colonic polyps: Secondary | ICD-10-CM

## 2022-06-26 DIAGNOSIS — K7030 Alcoholic cirrhosis of liver without ascites: Secondary | ICD-10-CM | POA: Diagnosis not present

## 2022-06-26 DIAGNOSIS — F1029 Alcohol dependence with unspecified alcohol-induced disorder: Secondary | ICD-10-CM | POA: Diagnosis not present

## 2022-06-26 LAB — COMPREHENSIVE METABOLIC PANEL
ALT: 37 U/L (ref 0–53)
AST: 75 U/L — ABNORMAL HIGH (ref 0–37)
Albumin: 3.9 g/dL (ref 3.5–5.2)
Alkaline Phosphatase: 99 U/L (ref 39–117)
BUN: 12 mg/dL (ref 6–23)
CO2: 24 mEq/L (ref 19–32)
Calcium: 9.6 mg/dL (ref 8.4–10.5)
Chloride: 106 mEq/L (ref 96–112)
Creatinine, Ser: 0.63 mg/dL (ref 0.40–1.50)
GFR: 108.79 mL/min (ref >60.00)
Glucose, Bld: 75 mg/dL (ref 70–99)
Potassium: 4.2 mEq/L (ref 3.5–5.1)
Sodium: 139 mEq/L (ref 135–145)
Total Bilirubin: 2.4 mg/dL — ABNORMAL HIGH (ref 0.2–1.2)
Total Protein: 7.3 g/dL (ref 6.0–8.3)

## 2022-06-26 LAB — CBC WITH DIFFERENTIAL/PLATELET
Basophils Absolute: 0.1 10*3/uL (ref 0.0–0.1)
Basophils Relative: 1.1 % (ref 0.0–3.0)
Eosinophils Absolute: 0.1 10*3/uL (ref 0.0–0.7)
Eosinophils Relative: 2 % (ref 0.0–5.0)
HCT: 30.4 % — ABNORMAL LOW (ref 39.0–52.0)
Hemoglobin: 10.3 g/dL — ABNORMAL LOW (ref 13.0–17.0)
Lymphocytes Relative: 28.3 % (ref 12.0–46.0)
Lymphs Abs: 1.4 10*3/uL (ref 0.7–4.0)
MCHC: 33.8 g/dL (ref 30.0–36.0)
MCV: 89.8 fl (ref 78.0–100.0)
Monocytes Absolute: 0.6 10*3/uL (ref 0.1–1.0)
Monocytes Relative: 11.9 % (ref 3.0–12.0)
Neutro Abs: 2.8 10*3/uL (ref 1.4–7.7)
Neutrophils Relative %: 56.7 % (ref 43.0–77.0)
Platelets: 136 10*3/uL — ABNORMAL LOW (ref 150.0–400.0)
RBC: 3.39 Mil/uL — ABNORMAL LOW (ref 4.22–5.81)
RDW: 16.5 % — ABNORMAL HIGH (ref 11.5–15.5)
WBC: 4.9 10*3/uL (ref 4.0–10.5)

## 2022-06-26 LAB — IBC + FERRITIN
Ferritin: 11.9 ng/mL — ABNORMAL LOW (ref 22.0–322.0)
Iron: 86 ug/dL (ref 42–165)
Saturation Ratios: 18.6 % — ABNORMAL LOW (ref 20.0–50.0)
TIBC: 462 ug/dL — ABNORMAL HIGH (ref 250.0–450.0)
Transferrin: 330 mg/dL (ref 212.0–360.0)

## 2022-06-26 LAB — PROTIME-INR
INR: 1.4 ratio — ABNORMAL HIGH (ref 0.8–1.0)
Prothrombin Time: 15.3 s — ABNORMAL HIGH (ref 9.6–13.1)

## 2022-06-26 LAB — B12 AND FOLATE PANEL
Folate: 6.8 ng/mL (ref >5.9)
Vitamin B-12: 535 pg/mL (ref 211–911)

## 2022-06-26 LAB — HIGH SENSITIVITY CRP: CRP, High Sensitivity: 4.96 mg/L (ref 0.000–5.000)

## 2022-06-26 NOTE — Progress Notes (Signed)
Miguel Guerra    ZC:[id number]    11/06/1970  Primary Care Physician:Shaw, Emily Filbert., MD  Referring Physician: Ginger Organ., MD [address] [place],  [place] [address]   Chief complaint:  cirrhosis  HPI:  52 yr M is here for follow up for cirrhosis.   Patient was last seen by me on 04/14/22 for follow up. He was doing well at that time. He was taking Lactulose with x3-4 soft BM per day.   Today, patient states he is doing okay overall without any new GI concerns. His weight seems to be unchanged. He denies any new or worsening abdominal bloating. Patient reports no bloody stools, black stool, or abdominal pain.  He states that he has been compliant with Nadolol 89m daily.  Pt drinks wine usually 2-3x a week usually on the weekend. He is drinking a half glass of wine at those times per patient. He is trying to cut back.  He reports some pain of the left ankle due to a accident wherein a friend fell on him.  Patient lives alone at home. He is not working. [place] [facility] he no longer has any family nearby.  GI Hx:   EGD: 03/08/21 - Grade II esophageal varices. - Z-line regular, 36 cm from the incisors. - Portal hypertensive gastropathy. - Friable gastric mucosa. Biopsied. - Normal examined duodenum.  Colonoscopy 03/08/21 - One 22 mm polyp in the rectum, removed with a hot snare. Resected and retrieved. Clip (MR conditional) was placed. - Diverticulosis in the sigmoid colon, in the descending colon and in the ascending colon. - Non-bleeding external and internal hemorrhoids.   Current Outpatient Medications:    acetaminophen (TYLENOL) 500 MG tablet, Take 500-1,000 mg by mouth every 6 (six) hours as needed (pain.)., Disp: , Rfl:    ALPRAZolam (XANAX) 0.5 MG tablet, Take 1 tablet (0.5 mg total) by mouth 2 (two) times daily as needed for anxiety., Disp: 60 tablet, Rfl: 5   atorvastatin (LIPITOR) 40 MG tablet, Take 40 mg by mouth in  the morning., Disp: , Rfl:    insulin NPH-regular Human (70-30) 100 UNIT/ML injection, Inject 25 Units into the skin 2 (two) times daily with a meal., Disp: , Rfl:    lactulose (CHRONULAC) 10 GM/15ML solution, Take 20 g by mouth 3 (three) times daily., Disp: , Rfl:    levothyroxine (SYNTHROID) 50 MCG tablet, Take 50 mcg by mouth daily before breakfast., Disp: , Rfl:    lisinopril (PRINIVIL,ZESTRIL) 10 MG tablet, Take 10 mg by mouth in the morning., Disp: , Rfl:    metFORMIN (GLUCOPHAGE) 500 MG tablet, Take 500 mg by mouth 2 (two) times daily., Disp: , Rfl:    nadolol (CORGARD) 20 MG tablet, Take 1 tablet (20 mg total) by mouth daily., Disp: 30 tablet, Rfl: 3   OLANZapine (ZYPREXA) 20 MG tablet, Take 1 tablet (20 mg total) by mouth daily at 12 noon., Disp: 90 tablet, Rfl: 2   omeprazole (PRILOSEC) 40 MG capsule, TAKE 1 CAPSULE (40 MG TOTAL) BY MOUTH DAILY., Disp: 90 capsule, Rfl: 1    Allergies as of 06/26/2022 - Review Complete 06/26/2022  Allergen Reaction Noted   Penicillins Other (See Comments)     Past Medical History:  Diagnosis Date   Anxiety    Bipolar 1 disorder ([place]    [place] of [place] ([place]    Diabetes mellitus without complication ([place]  off medication x 1 month   Heartburn    Hematuria    History of alcohol abuse    Hyperlipemia    Hypertension    OCD (obsessive compulsive disorder)    Other symptoms involving urinary system(788.99)    Tourette disorder     Past Surgical History:  Procedure Laterality Date   MULTIPLE TOOTH EXTRACTIONS  2011    Family History  Problem Relation Age of Onset   Breast cancer Mother    OCD Mother    Skin cancer Father    Prostate cancer Father    Pancreatic cancer Paternal Grandfather    Colon cancer Neg Hx    Esophageal cancer Neg Hx    Stomach cancer Neg Hx     Social History   Socioeconomic History   Marital status: Single    Spouse name: Not on file   Number of children: 0   Years of education: college    Highest education level: Not on file  Occupational History   Occupation: umemployed  Tobacco Use   Smoking status: Former    Types: Cigarettes   Smokeless tobacco: Never  Vaping Use   Vaping Use: Never used  Substance and Sexual Activity   Alcohol use: Yes    Comment: 3 small amounts of wine weekly (06-26-22)   Drug use: No   Sexual activity: Not on file  Other Topics Concern   Not on file  Social History Narrative   Not on file   Social Determinants of Health   Financial Resource Strain: Not on file  Food Insecurity: Not on file  Transportation Needs: Not on file  Physical Activity: Not on file  Stress: Not on file  Social Connections: Not on file  Intimate Partner Violence: Not on file      Review of systems: Review of Systems  Constitutional:  Negative for appetite change and fever.  HENT:  Negative for trouble swallowing.   Respiratory:  Negative for cough and shortness of breath.   Cardiovascular:  Negative for chest pain.  Gastrointestinal:  Negative for abdominal distention, abdominal pain, anal bleeding, blood in stool, constipation, diarrhea, nausea, rectal pain and vomiting.  Genitourinary:  Negative for dysuria.  Musculoskeletal:  Negative for back pain.  Skin:  Negative for rash.  Neurological:  Negative for weakness.  All other systems reviewed and are negative.     Physical Exam: There were no vitals filed for this visit.  Body mass index is 25.31 kg/m. General: well-appearing  Eyes: sclera anicteric, no redness ENT: oral mucosa moist without lesions, no cervical or supraclavicular lymphadenopathy CV: RRR, no JVD, no peripheral edema Resp: clear to auscultation bilaterally, normal RR and effort noted GI: soft, no tenderness, with active bowel sounds. + ascites.  No guarding or palpable organomegaly noted. Skin; warm and dry, no rash or jaundice noted Neuro: awake, alert and oriented x 3. Normal gross motor function and fluent speech No  Asterixis.  Data Reviewed:  Reviewed labs, radiology imaging, old records and pertinent past GI work up   Assessment and Plan/Recommendations:  52 year old male with decompensated alcoholic cirrhosis with hepatic encephalopathy and ascites  MELD 3.0: 13 at 04/14/2022 10:27 AM MELD-Na: 14 at 04/14/2022 10:27 AM   Hepatic encephalopathy: Continue lactulose with goal 3 bowel movements per day  Esophageal varices: Continue Nadolol 20 mg daily for bleeding prophylaxis EGD for surveillance of esophageal varices, persistent iron def anemia  H/o of large adenomatous colon polyps, due for surveillance  Pt is advised  on complete alcohol cessation. Will be referred to behavioral therapy for alcohol dependence.  Endoscopy and colonoscopy to be scheuled The risks and benefits as well as alternatives of colonoscopic/endoscopic procedure(s) have been discussed and reviewed. All questions answered. The patient agrees to proceed.   Ascites on exam, will order RUQ abdominal US.  We will perform blood work: AFP, CBC, CMP, PT/INR, iron panel, folate, thiamine, and B12.  Return in 2 months    CC: Ginger Organ., MD   I,Alexis Herring,acting as a scribe for Harl Bowie, MD.,have documented all relevant documentation on the behalf of Harl Bowie, MD,as directed by  Harl Bowie, MD while in the presence of Harl Bowie, MD.  I, Harl Bowie, MD, have reviewed all documentation for this visit. The documentation on 06/26/22 for the exam, diagnosis, procedures, and orders are all accurate and complete.   This visit required >40 minutes of patient care (this includes precharting, chart review, review of results, face-to-face time used for counseling as well as treatment plan and follow-up. The patient was provided an opportunity to ask questions and all were answered. The patient agreed with the plan and demonstrated an understanding of the instructions.  Damaris Hippo  , MD

## 2022-06-26 NOTE — Patient Instructions (Addendum)
You will be contacted by [facility] in the next 2 days to arrange a Ultrasound.  The number on your caller ID will be [phone], please answer when they call.  If you have not heard from them in 2 days please call [phone] to schedule.     You need to have a colonoscopy/Endoscopy scheduled at [facility], Today we did not have any openings at the hospital. We will be in touch with you when they become available   Your provider has requested that you go to the basement level for lab work before leaving today. Press "B" on the elevator. The lab is located at the first door on the left as you exit the elevator.   Due to recent changes in healthcare laws, you may see the results of your imaging and laboratory studies on MyChart before your provider has had a chance to review them.  We understand that in some cases there may be results that are confusing or concerning to you. Not all laboratory results come back in the same time frame and the provider may be waiting for multiple results in order to interpret others.  Please give [place] 48 hours in order for your provider to thoroughly review all the results before contacting the office for clarification of your results.    _______________________________________________________  If your blood pressure at your visit was 140/90 or greater, please contact your primary care physician to follow up on this.  _______________________________________________________  If you are age 84 or older, your body mass index should be between 23-30. Your Body mass index is 25.31 kg/m. If this is out of the aforementioned range listed, please consider follow up with your Primary Care Provider.  If you are age 42 or younger, your body mass index should be between 19-25. Your Body mass index is 25.31 kg/m. If this is out of the aformentioned range listed, please consider follow up with your Primary Care Provider.    ________________________________________________________  The [facility] providers would like to encourage you to use [facility] to communicate with providers for non-urgent requests or questions.  Due to long hold times on the telephone, sending your provider a message by [facility] may be a faster and more efficient way to get a response.  Please allow 48 business hours for a response.  Please remember that this is for non-urgent requests.  _______________________________________________________   I appreciate the  opportunity to care for you  Thank You   Harl Bowie , MD

## 2022-06-30 LAB — AFP TUMOR MARKER: AFP-Tumor Marker: 11.7 ng/mL — ABNORMAL HIGH (ref <6.1)

## 2022-07-21 ENCOUNTER — Other Ambulatory Visit: Payer: Self-pay

## 2022-07-21 DIAGNOSIS — K7031 Alcoholic cirrhosis of liver with ascites: Secondary | ICD-10-CM

## 2022-07-21 DIAGNOSIS — R772 Abnormality of alphafetoprotein: Secondary | ICD-10-CM

## 2022-08-06 ENCOUNTER — Telehealth: Payer: Self-pay | Admitting: *Deleted

## 2022-08-06 NOTE — Telephone Encounter (Signed)
Called patient to schedule a hospital colonoscopy/endoscopy at [facility]. We have some availabilities. He did not want to schedule even though I told him we have a date in Aug if he wanted to wait a few months. I explained to him these new dates fill up very quickly and he would need to make his mind up soon. I also reminded him to keep his scheduled MRI of his liver. He said he was going to that.    Patient was on wait list, with him refusing an appointment which I told him I could give him Aug. I am removing him from wait list. I will inform Dr Darleen Crocker  Patient said he would still do imaging

## 2022-08-12 NOTE — Telephone Encounter (Signed)
Got it, thank you!!

## 2022-08-13 DIAGNOSIS — E785 Hyperlipidemia, unspecified: Secondary | ICD-10-CM | POA: Diagnosis not present

## 2022-08-13 DIAGNOSIS — R7989 Other specified abnormal findings of blood chemistry: Secondary | ICD-10-CM | POA: Diagnosis not present

## 2022-08-13 DIAGNOSIS — E039 Hypothyroidism, unspecified: Secondary | ICD-10-CM | POA: Diagnosis not present

## 2022-08-13 DIAGNOSIS — E1149 Type 2 diabetes mellitus with other diabetic neurological complication: Secondary | ICD-10-CM | POA: Diagnosis not present

## 2022-08-13 DIAGNOSIS — Z125 Encounter for screening for malignant neoplasm of prostate: Secondary | ICD-10-CM | POA: Diagnosis not present

## 2022-08-13 DIAGNOSIS — I10 Essential (primary) hypertension: Secondary | ICD-10-CM | POA: Diagnosis not present

## 2022-08-16 ENCOUNTER — Other Ambulatory Visit: Payer: Self-pay | Admitting: Gastroenterology

## 2022-08-19 NOTE — Progress Notes (Deleted)
08/19/2022 Miguel Guerra [id number] 10/12/1970  Referring provider: Cleatis Polka., MD Primary GI doctor: Dr. Lavon Paganini  ASSESSMENT AND PLAN:     History of Present Illness:  52 y.o. male with medical history significant for insulin-dependent type 2 diabetes, hypothyroidism, hypertension, bipolar disorder, alcohol dependence, thrombocytopenia with cirrhosis, GERD, presents for follow up of cirrhosis secondary to ETOH.  Last EGD was 03/08/2021, showed grade 2 esophageal varices, PHG, nadolol 20 mg started tolerating well with BP and HR. Recall EGD 03/2023. 03/08/2021 Colon  22 mm polyp rectum, MR placed, tics and non bleeding external and internal hemorrhoids 01/31/22 AB ultrasound with large amount of gallbladder sludge and no evidence of cholelithiasis or acute cholecystitis as well as hepatic steatosis without focal liver lesions, mildly enlarged sleen. Last AFP 06/26/2022 11.7  Last INR: 06/26/2022 1.4   06/26/2022 office visit with Dr. Lavon Paganini, for IDA scheduled for EGD and colonoscopy in the hospital.  Patient has been reluctant to pick a date Patient was had elevation of AFP to 11.7 scheduled for MRI liver to exclude liver lesions,  has not scheduled.  {cirrhosishepaticencephalopathy:26796} He {Denies/complains:31533} swelling.  He {Actions; are/are not:16769} on spironolactone and lasix.  Wt Readings from Last 3 Encounters:  06/26/22 176 lb 6 oz (80 kg)  04/14/22 170 lb (77.1 kg)  01/30/22 167 lb 4 oz (75.9 kg)    Hepatitis immunity status:  01/22/2021 HepA NON-REACTIVE  01/22/2021 HepBsAG NON-REACTIVE  01/22/2021 HepAsAB NON-REACTIVE  01/22/2021 HepCAB NON-REACTIVE   Social history:  Patient denies history of injectable or intranasal drug use, tattoos, high risk sexual behavior, blood transfusion, incarceration, Financial planner, or travel outside the [place].  He {Actions; denies-reports:120008} ETOH use. Denies ever having attended an alcohol treatment  program. Denies history of DUI. Denies history of ETOH withdrawal.   He  reports that he has quit smoking. His smoking use included cigarettes. He has never used smokeless tobacco. He reports current alcohol use. He reports that he does not use drugs. His family history includes Breast cancer in his mother; OCD in his mother; Pancreatic cancer in his paternal grandfather; Prostate cancer in his father; Skin cancer in his father.   Current Medications:   Current Outpatient Medications (Endocrine & Metabolic):    insulin NPH-regular Human (70-30) 100 UNIT/ML injection, Inject 25 Units into the skin 2 (two) times daily with a meal.   levothyroxine (SYNTHROID) 50 MCG tablet, Take 50 mcg by mouth daily before breakfast.   metFORMIN (GLUCOPHAGE) 500 MG tablet, Take 500 mg by mouth 2 (two) times daily.  Current Outpatient Medications (Cardiovascular):    atorvastatin (LIPITOR) 40 MG tablet, Take 40 mg by mouth in the morning.   lisinopril (PRINIVIL,ZESTRIL) 10 MG tablet, Take 10 mg by mouth in the morning.   nadolol (CORGARD) 20 MG tablet, TAKE 1 TABLET BY MOUTH EVERY DAY   Current Outpatient Medications (Analgesics):    acetaminophen (TYLENOL) 500 MG tablet, Take 500-1,000 mg by mouth every 6 (six) hours as needed (pain.).   Current Outpatient Medications (Other):    ALPRAZolam (XANAX) 0.5 MG tablet, Take 1 tablet (0.5 mg total) by mouth 2 (two) times daily as needed for anxiety.   lactulose (CHRONULAC) 10 GM/15ML solution, Take 20 g by mouth 3 (three) times daily.   OLANZapine (ZYPREXA) 20 MG tablet, Take 1 tablet (20 mg total) by mouth daily at 12 noon.   omeprazole (PRILOSEC) 40 MG capsule, TAKE 1 CAPSULE (40 MG TOTAL) BY MOUTH DAILY.  Surgical History:  He  has a past surgical history that includes Multiple tooth extractions (2011).  Current Medications, Allergies, Past Medical History, Past Surgical History, Family History and Social History were reviewed in [employer] record.  Physical Exam: There were no vitals taken for this visit. General :  Alert, well developed male in no acute distress Head:  Normocephalic and atraumatic. Eyes :  sclerae anicteric,conjunctive pink  Heart:  regular rate and rhythm Pulm:  Clear anteriorly; no wheezing Abdomen:   Soft, Obese AB, skin exam [place], Normal bowel sounds. mild tenderness in the lower abdomen. Without guarding and Without rebound, hepatomegaly noted. no  fluid wave, shifting dullness noted slightly.  Extremities:   Without edema. Msk:  Symmetrical without gross deformities. Peripheral pulses intact.  Neurologic: Alert and  oriented x4;  grossly normal neurologically but he does have some slowed mentation with asterixis or clonus.  Skin:   without jaundice. no palmar erythema or spider angioma.   Psychiatric:  Demonstrates good judgement and reason, some slow affect   Doree Albee, PA-C 08/19/22

## 2022-08-20 DIAGNOSIS — Z23 Encounter for immunization: Secondary | ICD-10-CM | POA: Diagnosis not present

## 2022-08-20 DIAGNOSIS — Z1339 Encounter for screening examination for other mental health and behavioral disorders: Secondary | ICD-10-CM | POA: Diagnosis not present

## 2022-08-20 DIAGNOSIS — F1020 Alcohol dependence, uncomplicated: Secondary | ICD-10-CM | POA: Diagnosis not present

## 2022-08-20 DIAGNOSIS — I8510 Secondary esophageal varices without bleeding: Secondary | ICD-10-CM | POA: Diagnosis not present

## 2022-08-20 DIAGNOSIS — E1149 Type 2 diabetes mellitus with other diabetic neurological complication: Secondary | ICD-10-CM | POA: Diagnosis not present

## 2022-08-20 DIAGNOSIS — Z0000 Encounter for general adult medical examination without abnormal findings: Secondary | ICD-10-CM | POA: Diagnosis not present

## 2022-08-20 DIAGNOSIS — D696 Thrombocytopenia, unspecified: Secondary | ICD-10-CM | POA: Diagnosis not present

## 2022-08-20 DIAGNOSIS — K7031 Alcoholic cirrhosis of liver with ascites: Secondary | ICD-10-CM | POA: Diagnosis not present

## 2022-08-20 DIAGNOSIS — F319 Bipolar disorder, unspecified: Secondary | ICD-10-CM | POA: Diagnosis not present

## 2022-08-20 DIAGNOSIS — D638 Anemia in other chronic diseases classified elsewhere: Secondary | ICD-10-CM | POA: Diagnosis not present

## 2022-08-20 DIAGNOSIS — F17201 Nicotine dependence, unspecified, in remission: Secondary | ICD-10-CM | POA: Diagnosis not present

## 2022-08-20 DIAGNOSIS — I10 Essential (primary) hypertension: Secondary | ICD-10-CM | POA: Diagnosis not present

## 2022-08-20 DIAGNOSIS — E785 Hyperlipidemia, unspecified: Secondary | ICD-10-CM | POA: Diagnosis not present

## 2022-08-20 DIAGNOSIS — Z7689 Persons encountering health services in other specified circumstances: Secondary | ICD-10-CM | POA: Diagnosis not present

## 2022-08-20 DIAGNOSIS — Z1331 Encounter for screening for depression: Secondary | ICD-10-CM | POA: Diagnosis not present

## 2022-08-20 DIAGNOSIS — Z811 Family history of alcohol abuse and dependence: Secondary | ICD-10-CM | POA: Diagnosis not present

## 2022-08-21 ENCOUNTER — Other Ambulatory Visit: Payer: PPO

## 2022-08-25 ENCOUNTER — Ambulatory Visit: Payer: PPO | Admitting: Physician Assistant

## 2022-08-25 DIAGNOSIS — F1029 Alcohol dependence with unspecified alcohol-induced disorder: Secondary | ICD-10-CM

## 2022-08-25 DIAGNOSIS — R772 Abnormality of alphafetoprotein: Secondary | ICD-10-CM

## 2022-08-25 DIAGNOSIS — E119 Type 2 diabetes mellitus without complications: Secondary | ICD-10-CM

## 2022-08-25 DIAGNOSIS — D509 Iron deficiency anemia, unspecified: Secondary | ICD-10-CM

## 2022-08-25 DIAGNOSIS — D128 Benign neoplasm of rectum: Secondary | ICD-10-CM

## 2022-08-25 DIAGNOSIS — K7682 Hepatic encephalopathy: Secondary | ICD-10-CM

## 2022-08-25 DIAGNOSIS — K7031 Alcoholic cirrhosis of liver with ascites: Secondary | ICD-10-CM

## 2022-08-25 DIAGNOSIS — I8510 Secondary esophageal varices without bleeding: Secondary | ICD-10-CM

## 2022-08-27 DIAGNOSIS — K7031 Alcoholic cirrhosis of liver with ascites: Secondary | ICD-10-CM | POA: Diagnosis not present

## 2022-08-27 DIAGNOSIS — I10 Essential (primary) hypertension: Secondary | ICD-10-CM | POA: Diagnosis not present

## 2022-08-27 DIAGNOSIS — K7682 Hepatic encephalopathy: Secondary | ICD-10-CM | POA: Diagnosis not present

## 2022-08-27 DIAGNOSIS — D638 Anemia in other chronic diseases classified elsewhere: Secondary | ICD-10-CM | POA: Diagnosis not present

## 2022-09-13 ENCOUNTER — Ambulatory Visit
Admission: RE | Admit: 2022-09-13 | Discharge: 2022-09-13 | Disposition: A | Discharge status: Home / Self Care | Payer: PPO | Source: Ambulatory Visit | Attending: Gastroenterology | Admitting: Gastroenterology

## 2022-09-13 DIAGNOSIS — K7460 Unspecified cirrhosis of liver: Secondary | ICD-10-CM | POA: Diagnosis not present

## 2022-09-13 DIAGNOSIS — I700 Atherosclerosis of aorta: Secondary | ICD-10-CM | POA: Diagnosis not present

## 2022-09-13 DIAGNOSIS — R772 Abnormality of alphafetoprotein: Secondary | ICD-10-CM | POA: Diagnosis not present

## 2022-09-13 DIAGNOSIS — K7031 Alcoholic cirrhosis of liver with ascites: Secondary | ICD-10-CM

## 2022-09-13 DIAGNOSIS — K8080 Other cholelithiasis without obstruction: Secondary | ICD-10-CM | POA: Diagnosis not present

## 2022-09-13 MED ORDER — GADOPICLENOL 0.5 MMOL/ML IV SOLN
8.0000 mL | Freq: Once | INTRAVENOUS | Status: AC | PRN
  Administered 2022-09-13: 8 mL via INTRAVENOUS

## 2022-09-19 DIAGNOSIS — Z7689 Persons encountering health services in other specified circumstances: Secondary | ICD-10-CM | POA: Diagnosis not present

## 2022-09-19 DIAGNOSIS — I10 Essential (primary) hypertension: Secondary | ICD-10-CM | POA: Diagnosis not present

## 2022-09-19 DIAGNOSIS — I8501 Esophageal varices with bleeding: Secondary | ICD-10-CM | POA: Diagnosis not present

## 2022-09-19 DIAGNOSIS — D638 Anemia in other chronic diseases classified elsewhere: Secondary | ICD-10-CM | POA: Diagnosis not present

## 2022-09-19 DIAGNOSIS — K921 Melena: Secondary | ICD-10-CM | POA: Diagnosis not present

## 2022-09-19 DIAGNOSIS — K7682 Hepatic encephalopathy: Secondary | ICD-10-CM | POA: Diagnosis not present

## 2022-09-19 DIAGNOSIS — K7031 Alcoholic cirrhosis of liver with ascites: Secondary | ICD-10-CM | POA: Diagnosis not present

## 2022-10-07 ENCOUNTER — Other Ambulatory Visit: Payer: Self-pay | Admitting: Psychiatry

## 2022-10-07 DIAGNOSIS — F4001 Agoraphobia with panic disorder: Secondary | ICD-10-CM

## 2022-10-16 NOTE — Progress Notes (Unsigned)
10/17/2022 Miguel Guerra [id number] [birth year]  Referring provider: Cleatis Polka., MD Primary GI doctor: Dr. Lavon Paganini  ASSESSMENT AND PLAN:   Alcoholic cirrhosis of liver without ascites (HCC) with PHTN, varices, thrombocytopenia, HE  AST 75 ALT 37  Alkphos 99 TBili 2.4 INR 06/26/2022 1.4  MELD NA from 06/26/2022 at 14 -Plan for AB [place] to rule out ascites with leg swelling - may need to start some lasix/spironolactone but he has some dizziness, may need to monitor BP and do close follow up - check labs including magnesium -Nutrition and low sodium diet discussed with patient and information given -Continue daily multivitamin -Recommended 30 minutes of aerobic and resistance exercise 3 days/week  Leg swelling, worse in joint with some warmth Right greater than left Will get DVT US, check uric acid for gout Go to ER if any SOB  Encephalopathy, hepatic (HCC) Continue MVIT - Lactulose 30ml BID titrate to 3bms per day - minimize/remove all benzos and narcotics -Patient advised to not drive due to impaired reflexes - add on zinc  Esophageal varices in alcoholic cirrhosis (HCC) with dysphagia Last EGD was 03/08/2021, showed grade 2 esophageal varices, PHG, nadolol 20 mg started tolerating well with BP and HR.  Recall EGD 03/2023. Patient having some progressive dysphagia and evidence of gastritis on recent MRI AB, will plan for EGD in the hospital to evaluate.  I discussed risks of EGD with patient today, including risk of sedation, bleeding or perforation.  Patient provides understanding and gave verbal consent to proceed. No melena or signs of bleeding ER precautions discussed with the patient.  Rectal benign neoplasm 03/08/2021 colonoscopy with one 22 mm polyp in the rectum, diverticulosis in the sigmoid, descending and ascending colon and nonbleeding external and internal hemorrhoids. Recall 03/2026  Insulin dependent type 2 diabetes mellitus (HCC) Follow up  PCP  Elevated AFP Was 11 last time in Feb Will recheck today Getting AB [place] to evaluate for ascites thought I think it may be more body habitus If continually elevated may need to consider MRI AB with out and without with sedation due to motion degradation versus will talk with Dr. Lavon Paganini if other options  History of Present Illness:  52 y.o. male with medical history significant for insulin-dependent type 2 diabetes, hypothyroidism, hypertension, bipolar disorder, alcohol dependence, thrombocytopenia with cirrhosis, GERD, presents for follow up of cirrhosis secondary to ETOH.  Last EGD was 03/08/2021, showed grade 2 esophageal varices, PHG, nadolol 20 mg started tolerating well with BP and HR. Recall EGD 03/2023. 09/13/2022 MRI liver with and without contrast due to elevated AFP shows mild to moderate motion degradation, cirrhosis hepatomegaly portal venous hypertension no hepatocellular carcinoma identified, if AFP remains elevated consider 62-month follow-up pre and postcontrast abdominal MR with sedation.  Proximal gastric wall thickening could be underdistention or gastritis, cholelithiasis. Patient was scheduled [facility]oscopy [facility] for dysphagia 02/2022 but canceled.  With gastritis seen on MRI, Dr. Lavon Paganini suggested scheduling EGD in the hospital for evaluation of gastritis and varices.  He is on lactulose 15 ml twice a day, will have 2-3 Bm's and then not take it.  He is right handed, has shaking left hand with intention and mild without intention but denies confusing.  He has had mild swelling in his ankles, he is having swelling in his AB.  He is not on lasix or spironolactone.  No ETOH in last 2 months.  He has dizziness, standing or sitting, feeling of passing out. No SOB, no  chest pain. He has had some close falls. He denies numbness tingling in legs. He is on gummy MVIT No fever, chills.   Wt Readings from Last 3 Encounters:  10/17/22 172 lb (78 kg)  06/26/22 176 lb 6  oz (80 kg)  04/14/22 170 lb (77.1 kg)   Hepatitis immunity status: got with the pharmacy 01/22/2021 HepA NON-REACTIVE  01/22/2021 HepBsAG NON-REACTIVE  01/22/2021 HepAsAB NON-REACTIVE  01/22/2021 HepCAB NON-REACTIVE   03/08/2021 colonoscopy with one 22 mm polyp in the rectum, diverticulosis in the sigmoid, descending and ascending colon and nonbleeding external and internal hemorrhoids. Recall 2027  EGD on the same day with grade 2 esophageal varices, portal hypertensive gastropathy and friable gastric mucosa.  At that time his Omeprazole was increased to 40 mg daily and he was started on Nadolol 20 mg daily for esophageal varices.  He  reports that he has quit smoking. His smoking use included cigarettes. He has never used smokeless tobacco. He reports current alcohol use. He reports that he does not use drugs. His family history includes Breast cancer in his mother; OCD in his mother; Pancreatic cancer in his paternal grandfather; Prostate cancer in his father; Skin cancer in his father.   Current Medications:   Current Outpatient Medications (Endocrine & Metabolic):    insulin NPH-regular Human (70-30) 100 UNIT/ML injection, Inject 25 Units into the skin 2 (two) times daily with a meal.   levothyroxine (SYNTHROID) 50 MCG tablet, Take 50 mcg by mouth daily before breakfast.   metFORMIN (GLUCOPHAGE) 500 MG tablet, Take 500 mg by mouth 2 (two) times daily.  Current Outpatient Medications (Cardiovascular):    atorvastatin (LIPITOR) 40 MG tablet, Take 40 mg by mouth in the morning.   lisinopril (PRINIVIL,ZESTRIL) 10 MG tablet, Take 10 mg by mouth in the morning.   nadolol (CORGARD) 20 MG tablet, TAKE 1 TABLET BY MOUTH EVERY DAY   Current Outpatient Medications (Analgesics):    acetaminophen (TYLENOL) 500 MG tablet, Take 500-1,000 mg by mouth every 6 (six) hours as needed (pain.).   Current Outpatient Medications (Other):    ALPRAZolam (XANAX) 0.5 MG tablet, TAKE 1 TABLET BY MOUTH TWICE A  DAY AS NEEDED FOR ANXIETY   lactulose (CHRONULAC) 10 GM/15ML solution, Take 20 g by mouth 3 (three) times daily.   OLANZapine (ZYPREXA) 20 MG tablet, Take 1 tablet (20 mg total) by mouth daily at 12 noon.   omeprazole (PRILOSEC) 40 MG capsule, TAKE 1 CAPSULE (40 MG TOTAL) BY MOUTH DAILY.  Surgical History:  He  has a past surgical history that includes Multiple tooth extractions (2011).  Current Medications, Allergies, Past Medical History, Past Surgical History, Family History and Social History were reviewed in [employer] record.  Physical Exam: BP 120/60   Pulse 74   Ht 5\' 10"  (1.778 m)   Wt 172 lb (78 kg)   BMI 24.68 kg/m  General :  Alert, well developed male in no acute distress Head:  Normocephalic and atraumatic. Eyes :  sclerae anicteric,conjunctive pink  Heart:  regular rate and rhythm Pulm:  Clear anteriorly; no wheezing Abdomen:   Soft, Obese AB, skin exam [place], Normal bowel sounds. mild tenderness in the lower abdomen. Without guarding and Without rebound, hepatomegaly noted. no  fluid wave, shifting dullness noted slightly but not distended very soft.  Extremities:   Right leg 2+ edema, compared to left leg mild edema.  Right ankle also swollen medial malleolus with some erythema and warmth.  Negative Homans.  Nontender calf. Msk:  Symmetrical without gross deformities. Peripheral pulses intact.  Neurologic: Alert and  oriented x4;  grossly normal neurologically without asterixis or clonus.  Skin:   without jaundice. no palmar erythema or spider angioma.   Psychiatric:  Demonstrates good judgement and reason, some slow affect   Doree Albee, PA-C 10/17/22

## 2022-10-17 ENCOUNTER — Other Ambulatory Visit (INDEPENDENT_AMBULATORY_CARE_PROVIDER_SITE_OTHER): Payer: PPO

## 2022-10-17 ENCOUNTER — Ambulatory Visit (INDEPENDENT_AMBULATORY_CARE_PROVIDER_SITE_OTHER): Payer: PPO | Admitting: Physician Assistant

## 2022-10-17 ENCOUNTER — Encounter: Payer: Self-pay | Admitting: Physician Assistant

## 2022-10-17 VITALS — BP 120/60 | HR 74 | Ht 70.0 in | Wt 172.0 lb

## 2022-10-17 DIAGNOSIS — K7031 Alcoholic cirrhosis of liver with ascites: Secondary | ICD-10-CM

## 2022-10-17 DIAGNOSIS — I8510 Secondary esophageal varices without bleeding: Secondary | ICD-10-CM

## 2022-10-17 DIAGNOSIS — R772 Abnormality of alphafetoprotein: Secondary | ICD-10-CM

## 2022-10-17 DIAGNOSIS — R42 Dizziness and giddiness: Secondary | ICD-10-CM | POA: Diagnosis not present

## 2022-10-17 DIAGNOSIS — F1029 Alcohol dependence with unspecified alcohol-induced disorder: Secondary | ICD-10-CM | POA: Diagnosis not present

## 2022-10-17 DIAGNOSIS — K7030 Alcoholic cirrhosis of liver without ascites: Secondary | ICD-10-CM

## 2022-10-17 DIAGNOSIS — M7989 Other specified soft tissue disorders: Secondary | ICD-10-CM | POA: Diagnosis not present

## 2022-10-17 DIAGNOSIS — R1900 Intra-abdominal and pelvic swelling, mass and lump, unspecified site: Secondary | ICD-10-CM

## 2022-10-17 DIAGNOSIS — K7682 Hepatic encephalopathy: Secondary | ICD-10-CM | POA: Diagnosis not present

## 2022-10-17 LAB — CBC WITH DIFFERENTIAL/PLATELET
Basophils Absolute: 0 10*3/uL (ref 0.0–0.1)
Basophils Relative: 1.4 % (ref 0.0–3.0)
Eosinophils Absolute: 0.1 10*3/uL (ref 0.0–0.7)
Eosinophils Relative: 2 % (ref 0.0–5.0)
HCT: 27.5 % — ABNORMAL LOW (ref 39.0–52.0)
Hemoglobin: 9 g/dL — ABNORMAL LOW (ref 13.0–17.0)
Lymphocytes Relative: 29.8 % (ref 12.0–46.0)
Lymphs Abs: 1 10*3/uL (ref 0.7–4.0)
MCHC: 32.6 g/dL (ref 30.0–36.0)
MCV: 88.6 fl (ref 78.0–100.0)
Monocytes Absolute: 0.3 10*3/uL (ref 0.1–1.0)
Monocytes Relative: 9.5 % (ref 3.0–12.0)
Neutro Abs: 2 10*3/uL (ref 1.4–7.7)
Neutrophils Relative %: 57.3 % (ref 43.0–77.0)
Platelets: 115 10*3/uL — ABNORMAL LOW (ref 150.0–400.0)
RBC: 3.1 Mil/uL — ABNORMAL LOW (ref 4.22–5.81)
RDW: 16.8 % — ABNORMAL HIGH (ref 11.5–15.5)
WBC: 3.5 10*3/uL — ABNORMAL LOW (ref 4.0–10.5)

## 2022-10-17 LAB — COMPREHENSIVE METABOLIC PANEL
ALT: 42 U/L (ref 0–53)
AST: 77 U/L — ABNORMAL HIGH (ref 0–37)
Albumin: 3.5 g/dL (ref 3.5–5.2)
Alkaline Phosphatase: 74 U/L (ref 39–117)
BUN: 5 mg/dL — ABNORMAL LOW (ref 6–23)
CO2: 26 mEq/L (ref 19–32)
Calcium: 9.2 mg/dL (ref 8.4–10.5)
Chloride: 104 mEq/L (ref 96–112)
Creatinine, Ser: 0.64 mg/dL (ref 0.40–1.50)
GFR: 108.04 mL/min (ref >60.00)
Glucose, Bld: 88 mg/dL (ref 70–99)
Potassium: 4 mEq/L (ref 3.5–5.1)
Sodium: 138 mEq/L (ref 135–145)
Total Bilirubin: 3 mg/dL — ABNORMAL HIGH (ref 0.2–1.2)
Total Protein: 7.3 g/dL (ref 6.0–8.3)

## 2022-10-17 LAB — PROTIME-INR
INR: 1.7 ratio — ABNORMAL HIGH (ref 0.8–1.0)
Prothrombin Time: 18 s — ABNORMAL HIGH (ref 9.6–13.1)

## 2022-10-17 LAB — MAGNESIUM: Magnesium: 1.4 mg/dL — ABNORMAL LOW (ref 1.5–2.5)

## 2022-10-17 LAB — URIC ACID: Uric Acid, Serum: 3.1 mg/dL — ABNORMAL LOW (ref 4.0–7.8)

## 2022-10-17 NOTE — Patient Instructions (Addendum)
We scheduled you a follow up with Dr Lavon Paganini on 02/12/2023 at 10:40am  Your provider has requested that you go to the basement level for lab work before leaving today. Press "B" on the elevator. The lab is located at the first door on the left as you exit the elevator.  You have been scheduled for an abdominal ultrasound at [facility] (1st floor of hospital) on 10/22/2022 at 10:30am. Please arrive 15 minutes prior to your appointment for registration. Make certain not to have anything to eat or drink 6 hours prior to your appointment. Should you need to reschedule your appointment, please contact radiology at [phone]. This test typically takes about 30 minutes to perform.  We have a vascular ultrasound scheduled for you on 10/20/2022 at 10:00am at [facility] (1st floor)  You have been scheduled for an endoscopy. Please follow written instructions given to you at your visit today. If you use inhalers (even only as needed), please bring them with you on the day of your procedure.  TYLENOL (ACETAMINOPHEN) IS SAFE IN LIVER DISEASE: You can take tylenol (acetaminophen) up to 2,000 mg/day. This would be four extra strength (500mg ) tablets over 24 hours OR six regular strength (325 mg) tablets over 24 hours. Please be sure to read the ingredients of over the counter medications and prescription pain medications as many contain acetaminophen.   NO NSAIDS (ibuprofen, advil, naproxen, aleve, motrin...)  HEPATIC ENCEPHALOPATHY HEPATIC ENCEPHALOPATHY: Confusion caused by a build up of toxins in the blood due to the liver not being able to filter toxins. This can cause confusion mild or severe, increase falls. If you have been diagnosed with Hepatic encephalopathy, advised to not drive due to increased risk   LACTULOSE:  helps pull ammonia and other toxins from the blood into your stool when you have a bowel movement NO NEED TO CHECK AMMONIA LEVEL IN BLOOD! Only need to check if you are  having symptoms. 30-96ml up to four times a day Take a dose in the morning If by lunch time you have not had AT LEAST 2 bowel movements take another dose If by dinner you have not had AT LEAST 2 bowel movements that day take another dose If by bedtime you still have not had 2 bowel movements take another dose Goal of 3-4 bowel movements per day Avoid taking with food as this will cause more gas  IF YOU ARE VERY SLEEPY, HARD FOR YOUR FAMILY TO WAKE YOU UP, OR FALLING ASLEEP DURING CONVERSATIONS -INCREASE LACTULOSE/GO TO THE EMERGENCY ROOM  SUPPLEMENTS: multivitamin Zinc 50 mg Making sure B vitamin is in your vitamin  PHYSICAL ACTIVITY It is important to continue to be active when you have cirrhosis. Exercise will help reduce muscle loss and weakness.  DISCUSS REFERRAL FOR PHYSICAL THERAPY WITH YOUR PRIMARY CARE PROVIDER  DIET/NUTRITION FOR CIRRHOSIS NO ALCOHOL YOUR GOALS Evening snack - high protein Supplements between meals to help meet calorie and protein goal: Boost Ensure Premier Protein Shakes Protein Greek yogurt Fish, chicken (NO RAW OR UNDERCOOKED FISH/SHELLFISH) Avoid pork and red meat Plant based protein (non-soy)/Vegan: Lentils, Chickpeas, Peanuts (non salted), almonds (non salted), quinoa, chia seeds  Plant based protein supplements (not soy)  Avoid/limit animal based protein supplements: whey, casein 4.  Low sodium (2,000 mg/day) A. Avoid: table salt, canned foods, deli meats, sausages, hot dogs, anything with a long shelf life B. Read nutrition labels and be aware of serving size. Don't go by percent of daily value.  _______________________________________________________  If your blood pressure at your visit was 140/90 or greater, please contact your primary care physician to follow up on this.  _______________________________________________________  If you are age 19 or older, your body mass index should be between 23-30. Your Body mass index is  24.68 kg/m. If this is out of the aforementioned range listed, please consider follow up with your Primary Care Provider.  If you are age 51 or younger, your body mass index should be between 19-25. Your Body mass index is 24.68 kg/m. If this is out of the aformentioned range listed, please consider follow up with your Primary Care Provider.   ________________________________________________________  The [place] [facility] providers would like to encourage you to use [facility] to communicate with providers for non-urgent requests or questions.  Due to long hold times on the telephone, sending your provider a message by [facility] may be a faster and more efficient way to get a response.  Please allow 48 business hours for a response.  Please remember that this is for non-urgent requests.  _______________________________________________________ It was a pleasure to see you today!  Thank you for trusting me with your gastrointestinal care!

## 2022-10-20 ENCOUNTER — Other Ambulatory Visit: Payer: Self-pay

## 2022-10-20 ENCOUNTER — Ambulatory Visit (HOSPITAL_COMMUNITY)
Admission: RE | Admit: 2022-10-20 | Discharge: 2022-10-20 | Disposition: A | Discharge status: Home / Self Care | Payer: PPO | Source: Ambulatory Visit | Attending: Physician Assistant | Admitting: Physician Assistant

## 2022-10-20 DIAGNOSIS — D649 Anemia, unspecified: Secondary | ICD-10-CM

## 2022-10-20 DIAGNOSIS — M7989 Other specified soft tissue disorders: Secondary | ICD-10-CM | POA: Diagnosis not present

## 2022-10-20 LAB — AFP TUMOR MARKER: AFP-Tumor Marker: 6.8 ng/mL — ABNORMAL HIGH (ref <6.1)

## 2022-10-20 NOTE — Progress Notes (Signed)
Right lower extremity venous duplex has been completed. Preliminary results can be found in CV Proc through chart review.  Results were given to Quentin Mulling PA.  10/20/22 9:51 AM Olen Cordial RVT

## 2022-10-22 ENCOUNTER — Ambulatory Visit (HOSPITAL_COMMUNITY)
Admission: RE | Admit: 2022-10-22 | Discharge: 2022-10-22 | Disposition: A | Discharge status: Home / Self Care | Payer: PPO | Source: Ambulatory Visit | Attending: Physician Assistant | Admitting: Physician Assistant

## 2022-10-22 DIAGNOSIS — R1900 Intra-abdominal and pelvic swelling, mass and lump, unspecified site: Secondary | ICD-10-CM | POA: Diagnosis not present

## 2022-10-22 DIAGNOSIS — K8020 Calculus of gallbladder without cholecystitis without obstruction: Secondary | ICD-10-CM | POA: Diagnosis not present

## 2022-10-22 DIAGNOSIS — R161 Splenomegaly, not elsewhere classified: Secondary | ICD-10-CM | POA: Diagnosis not present

## 2022-11-24 ENCOUNTER — Telehealth: Payer: Self-pay | Admitting: Gastroenterology

## 2022-11-24 NOTE — Telephone Encounter (Signed)
Inbound call from patient requesting to can 9/5 procedure and 10/10 follow up. States he would like to wait until he is due for colonoscopy. Please advise, thank you.

## 2022-11-24 NOTE — Telephone Encounter (Signed)
His colonoscopy is due in 2027. His last EGD was 03/08/2021.

## 2022-11-26 NOTE — Telephone Encounter (Signed)
Procedure date can be moved to 03/26/23 and colonoscopy added. Called the patient to discuss this option. No answer. Left a message of my call and asked he call back to discuss.

## 2022-11-26 NOTE — Telephone Encounter (Signed)
Beth let me know when this cancellation is confirmed so I can put another patient Dr Angus Palms wants in that spot

## 2022-11-26 NOTE — Telephone Encounter (Signed)
He is due for surveillance colonoscopy due to large adenomatous polyp removed from rectum in 2022 and he is also due for esophageal varices surveillance.  Please advise patient not to cancel appointments, he needs to undergo EGD and colonoscopy but if he continues to be noncompliant, will have to discharge patient from our practice

## 2022-11-26 NOTE — Telephone Encounter (Signed)
He had 20 mm Hamartomatous polyp removed from rectum in 2022, due for surveillance colonoscopy in Nov 2023. I reviewed the letter and recall, were placed incorrectly hence the recommendation for patient to undergo surveillance as scheduled now

## 2022-11-27 DIAGNOSIS — H5213 Myopia, bilateral: Secondary | ICD-10-CM | POA: Diagnosis not present

## 2022-11-27 DIAGNOSIS — E119 Type 2 diabetes mellitus without complications: Secondary | ICD-10-CM | POA: Diagnosis not present

## 2022-12-04 NOTE — Telephone Encounter (Signed)
Called the patient. No answer. Left a detailed message on the voicemail. He can be scheduled for both EGD and colonoscopy on 03/26/23. Asked he call back as soon as possible.

## 2022-12-08 NOTE — Telephone Encounter (Signed)
Called the patient. No answer. Left a detailed message on the voicemail. He can be scheduled for both EGD and colonoscopy on 03/26/23. Asked he call back as soon as possible.

## 2022-12-23 NOTE — Telephone Encounter (Signed)
Okay, it has been difficult to reach him in the past and he has canceled appointments

## 2022-12-24 ENCOUNTER — Other Ambulatory Visit: Payer: Self-pay

## 2022-12-24 NOTE — Telephone Encounter (Signed)
Letter mailed to the patient

## 2023-01-08 ENCOUNTER — Encounter (HOSPITAL_COMMUNITY): Payer: Self-pay

## 2023-01-08 ENCOUNTER — Ambulatory Visit (HOSPITAL_COMMUNITY): Admit: 2023-01-08 | Payer: PPO | Admitting: Gastroenterology

## 2023-01-08 SURGERY — ESOPHAGOGASTRODUODENOSCOPY (EGD) WITH PROPOFOL
Anesthesia: Monitor Anesthesia Care

## 2023-02-07 ENCOUNTER — Other Ambulatory Visit: Payer: Self-pay | Admitting: Psychiatry

## 2023-02-07 DIAGNOSIS — F250 Schizoaffective disorder, bipolar type: Secondary | ICD-10-CM

## 2023-02-08 NOTE — Telephone Encounter (Signed)
Has appt 10/8

## 2023-02-10 ENCOUNTER — Ambulatory Visit: Payer: PPO | Admitting: Psychiatry

## 2023-02-10 ENCOUNTER — Encounter: Payer: Self-pay | Admitting: Psychiatry

## 2023-02-10 DIAGNOSIS — F4010 Social phobia, unspecified: Secondary | ICD-10-CM | POA: Diagnosis not present

## 2023-02-10 DIAGNOSIS — F4001 Agoraphobia with panic disorder: Secondary | ICD-10-CM

## 2023-02-10 DIAGNOSIS — F1029 Alcohol dependence with unspecified alcohol-induced disorder: Secondary | ICD-10-CM | POA: Diagnosis not present

## 2023-02-10 DIAGNOSIS — F250 Schizoaffective disorder, bipolar type: Secondary | ICD-10-CM | POA: Diagnosis not present

## 2023-02-10 DIAGNOSIS — F952 Tourette's disorder: Secondary | ICD-10-CM

## 2023-02-10 MED ORDER — OLANZAPINE 20 MG PO TABS
20.0000 mg | ORAL_TABLET | Freq: Every day | ORAL | 0 refills | Status: AC
Start: 2023-02-10 — End: ?

## 2023-02-10 MED ORDER — ALPRAZOLAM 0.5 MG PO TABS
0.5000 mg | ORAL_TABLET | Freq: Two times a day (BID) | ORAL | 3 refills | Status: AC | PRN
Start: 2023-02-10 — End: ?

## 2023-02-10 NOTE — Progress Notes (Signed)
Miguel Guerra [id number] 1970-08-28 52 y.o.  Subjective:   Patient ID:  Miguel Guerra is a 52 y.o. (DOB 01/01/1971) male.  Chief Complaint:  Chief Complaint  Patient presents with   Follow-up    Anxiety Symptoms include nervous/anxious behavior. Patient reports no chest pain, confusion, decreased concentration, palpitations or suicidal ideas.     Miguel Guerra presents to the office today for follow-up of  tourette's disorder and psychosis.  02/2019 appt with the following noted: consistent with meds.  Status quo with sx as noted below.   Orap helps with tics but doesn't eliminate them.  It's awkward socially some but he doesn't have friends or go out in public much anyway bc of Covid.  I notice it all the time but has had facial and head tics since childhood.  Neurologist originally Rx the Orap and then PCP took it over.   Off and on visual hallucinations of people, bugs and animals and sometimes scary.  Still gets depressed but at baseline.  Satisfied with meds. Labs with PCP. Hyponatremia dt excessive water.  Stopped beer.  Not abusing alcohol.  Quit smoking cigarettes after smoking 2ppd for 16 years.  Already feels better. Overall doing ok with good days and bad days but stable compared to before. Sleep pretty good with meds.  Still sees shower people in the shower, 5 different people.  Can't explain that one.  Also sees images of people in other locations too.  Can see people walk in his house.  They are not always clear.  Not hearing voices for awhile.  Last AH a couple of mos ago.  May hear voices that wake him from sleep.  Sleeping better off cigarettes.  At first it scared him, but has gottten more used to it.   Occ feels watched.  No recent voices and hallucinations no longer waking him.  Not lasting depression.  Does have anxiety including social and panic.  Panic attacks like a heart attack and are scary.  Has had to leave store DT panic before.  Tries to avoid people.  Does  feel paranoid around people, feels they watch him.  Usually still going to gym now and enjoys the progress he sees. Quit drinking.  Some weight gain. Quit smoking and caffeine. Only taking 0.5 mg Xanax daily. Not abusing.  Can still have panic in public. Pending MRI head bc slurred speech and can't write anymore and hard to read.  ? Ministroke.  Had speech therapy as akid. Plan: No medication changes indicated except stop risperidone bc using Orap for tics now.    01/25/20 appt with the following noted: Had 2 weeks straight of seeing a person in front of him, usually a man, but then it went away.  Happened at home and parents' home.  It did not talk.  Frightens him when it happens.  You'd think I'd be used to it.  occ hears voices but overall feels psychotic sx are manageable. Stopped Orap bc of money. Sleeps well with olanzapine.   No beer in 8 mos.  Occ glass of wine at dinner.  Lost 20# off alcohol.  Also on a diet.  Never drank liquor.   Takes Xanax every AM for anxiety.   Tics involve oral and facial and neck movements and masks help hide this so is ok off Orap which he couldn't afford. He may want to try meds later. Plan: Continue olanzapine 20 mg HS He's not sure but thinks he stopped the lamotrigine.  07/23/2020 appointment with the following noted: Hanging in there.  About the same.  Still see things in the house but handles it.  Day or night can feel something is looking at me.  Turns around and sees it briefly and it dissipates.  Not talking to him.  Nothing too scary.   Anxiety managed with Xanax AM and rarely thereafter.   Occ depression but not all the time. No problems with olanzapine which helps him sleep.  Taking meds for DM.  Had DM for 6-7 years he thinks. Plan: Continue olanzapine 20 mg HS  And alprazolam.    01/24/21 appt noted: Good and bad days.  Excellent until 2-3 weeks ago started seeing people in the house staring at him after waking from sleep and once awake a  while then they go away.  But does scare him.  3-4 mos ago nothing was happenining.  Sleep unchanged and good. Takes about Xanax 0.5 mg per day/  people can make him anxious.  It does help but not abused. Stress money but mother helps him out.  Her health is good.  F NHP for dementia. No SE meds.  Depression managed. Tics come and go and doesn't like them but can't afford Orap.  Might get from [place]. Plan: continue alprazolam and olanzapine 20 mg HS  08/12/21 appt noted: Takes Xanax 0.5 mg mainly in am.  Consistent with olanzapine 20 mg HS. Still sees people in his house or parents house.  Afraid to go in parents house alone bc seen demonic things there.  Not afraid at his house but seen people talking to each other. No SE meds.  Depression managed. Tics come and go and doesn't like them but can't afford Orap.  Plan: Continue olanzapine 20 mg HS  And alprazolam.   05/08/21 appt noted: Doing OK overall.  Man named Miguel Guerra living with him for 2 mos.  It's a hallucination of a man.  Maybe it's all in my head.  Did not feel afraid.  They didn't talk much.   Consistent with olanzapine every morning and doesn't make him that sleepy.  Sleeps with the alprazolam 0.5 mg HS.   No SE with  meds. Some confusion.   M moved to [facility] and he's alone in [place].   Does not socialize much. Goes to grocery store.   No anger.  Lonely. Single all his life.  No best friend but a few friends. Plan: no changes  02/10/2023 appointment noted: Psych meds: Alprazolam 0.5 mg twice daily as needed, olanzapine 20 mg daily. Has alcoholic cirrhosis with esophageal varices and a history of hepatic encephalopathy Stopped drinking over the weekend and before that drinking 1-2 beers in the PM.  "I know I need to stop", bc of liver dz. For some reason I think someone is living with me and calls out to them.  Does not frighten him.  It's a man and he doesn't talk much. Consistent with meds.   Crying spells 3 times per week without  known trigger.  Always been lonely and now completely alone.  Gets bored.  Family, including parents,  [place] beach. Occ anxiety spells with heart racing and averages Xanax 1-2 daily and it helps. Olanzapine helps sleep.   Handles his own finances.    Patient on disability  Past Psychiatric Medication Trials:  risperidone, olanzapine 20, Orap $,  lamotrigine  3 [place].  He does not want to drive to  the beach.    Review of Systems:  Review of Systems  Cardiovascular:  Negative for chest pain and palpitations.  Gastrointestinal:  Negative for abdominal distention and diarrhea.  Neurological:  Negative for tremors.  Psychiatric/Behavioral:  Positive for hallucinations. Negative for agitation, behavioral problems, confusion, decreased concentration, dysphoric mood, self-injury, sleep disturbance and suicidal ideas. The patient is nervous/anxious. The patient is not hyperactive.     Medications: I have reviewed the patient's current medications.  Current Outpatient Medications  Medication Sig Dispense Refill   acetaminophen (TYLENOL) 500 MG tablet Take 500-1,000 mg by mouth every 6 (six) hours as needed (pain.).     atorvastatin (LIPITOR) 40 MG tablet Take 40 mg by mouth in the morning.     insulin NPH-regular Human (70-30) 100 UNIT/ML injection Inject 25 Units into the skin 2 (two) times daily with a meal.     lactulose (CHRONULAC) 10 GM/15ML solution Take 20 g by mouth 3 (three) times daily.     levothyroxine (SYNTHROID) 50 MCG tablet Take 50 mcg by mouth daily before breakfast.     lisinopril (PRINIVIL,ZESTRIL) 10 MG tablet Take 10 mg by mouth in the morning.     metFORMIN (GLUCOPHAGE) 500 MG tablet Take 500 mg by mouth 2 (two) times daily.     nadolol (CORGARD) 20 MG tablet TAKE 1 TABLET BY MOUTH EVERY DAY 90 tablet 1   omeprazole (PRILOSEC) 40 MG capsule TAKE 1 CAPSULE (40 MG TOTAL) BY MOUTH DAILY. 90 capsule 1   ALPRAZolam (XANAX) 0.5 MG  tablet Take 1 tablet (0.5 mg total) by mouth 2 (two) times daily as needed for anxiety. 60 tablet 3   OLANZapine (ZYPREXA) 20 MG tablet Take 1 tablet (20 mg total) by mouth daily at 12 noon. 90 tablet 0   No current facility-administered medications for this visit.    Medication Side Effects: Other: dry mouth  Allergies:  Allergies  Allergen Reactions   Penicillins Other (See Comments)    swelling of Neck    Past Medical History:  Diagnosis Date   Anxiety    Bipolar 1 disorder (HCC)    Cirrhosis of liver (HCC)    Diabetes mellitus without complication (HCC)    off medication x 1 month   Heartburn    Hematuria    History of alcohol abuse    Hyperlipemia    Hypertension    OCD (obsessive compulsive disorder)    Other symptoms involving urinary system(788.99)    Tourette disorder     Family History  Problem Relation Age of Onset   Breast cancer Mother    OCD Mother    Skin cancer Father    Prostate cancer Father    Pancreatic cancer Paternal Grandfather    Colon cancer Neg Hx    Esophageal cancer Neg Hx    Stomach cancer Neg Hx     Social History   Socioeconomic History   Marital status: Single    Spouse name: Not on file   Number of children: 0   Years of education: college   Highest education level: Not on file  Occupational History   Occupation: umemployed  Tobacco Use   Smoking status: Former    Types: Cigarettes   Smokeless tobacco: Never  Vaping Use   Vaping status: Never Used  Substance and Sexual Activity   Alcohol use: Yes    Comment: 3 small amounts of wine weekly (06-26-22)   Drug use: No   Sexual activity: Not on file  Other  Topics Concern   Not on file  Social History Narrative   Not on file   Social Determinants of Health   Financial Resource Strain: Not on file  Food Insecurity: Not on file  Transportation Needs: Not on file  Physical Activity: Not on file  Stress: Not on file  Social Connections: Not on file  Intimate Partner  Violence: Not on file    Past Medical History, Surgical history, Social history, and Family history were reviewed and updated as appropriate.   Please see review of systems for further details on the patient's review from today.   Objective:   Physical Exam:  There were no vitals taken for this visit.  Physical Exam Constitutional:      General: He is not in acute distress. Musculoskeletal:        General: No deformity.  Neurological:     Mental Status: He is alert and oriented to person, place, and time.     Cranial Nerves: No dysarthria.     Coordination: Coordination normal.  Psychiatric:        Attention and Perception: Attention normal. He perceives visual hallucinations. He does not perceive auditory hallucinations.        Mood and Affect: Mood is anxious. Mood is not depressed. Affect is not labile, blunt, angry or inappropriate.        Speech: Speech normal.        Behavior: Behavior normal. Behavior is cooperative.        Thought Content: Thought content is not paranoid or delusional. Thought content does not include homicidal or suicidal ideation. Thought content does not include suicidal plan.        Cognition and Memory: Cognition and memory normal.        Judgment: Judgment is inappropriate.     Comments: Insight fair.  Hallucinations are maybe a little worse but he's not bothered.   Lonely.   No AIM Judgment fair.       Lab Review:     Component Value Date/Time   NA 138 10/17/2022 1546   NA 137 02/22/2014 0834   K 4.0 10/17/2022 1546   CL 104 10/17/2022 1546   CO2 26 10/17/2022 1546   GLUCOSE 88 10/17/2022 1546   BUN 5 (L) 10/17/2022 1546   BUN 14 02/22/2014 0834   CREATININE 0.64 10/17/2022 1546   CALCIUM 9.2 10/17/2022 1546   PROT 7.3 10/17/2022 1546   PROT 7.1 02/22/2014 0834   ALBUMIN 3.5 10/17/2022 1546   ALBUMIN 4.7 02/22/2014 0834   AST 77 (H) 10/17/2022 1546   ALT 42 10/17/2022 1546   ALKPHOS 74 10/17/2022 1546   BILITOT 3.0 (H) 10/17/2022  1546   GFRNONAA 79 02/22/2014 0834   GFRAA 92 02/22/2014 0834       Component Value Date/Time   WBC 3.5 (L) 10/17/2022 1546   RBC 3.10 (L) 10/17/2022 1546   HGB 9.0 (L) 10/17/2022 1546   HCT 27.5 (L) 10/17/2022 1546   PLT 115.0 (L) 10/17/2022 1546   MCV 88.6 10/17/2022 1546   MCHC 32.6 10/17/2022 1546   RDW 16.8 (H) 10/17/2022 1546   LYMPHSABS 1.0 10/17/2022 1546   MONOABS 0.3 10/17/2022 1546   EOSABS 0.1 10/17/2022 1546   BASOSABS 0.0 10/17/2022 1546    No results found for: "POCLITH", "LITHIUM"   Lab Results  Component Value Date   VALPROATE 55 02/22/2014     .res Assessment: Plan:    Schizoaffective disorder, bipolar type (HCC) - Plan: OLANZapine (  ZYPREXA) 20 MG tablet  Panic disorder with agoraphobia - Plan: ALPRAZolam (XANAX) 0.5 MG tablet  Social anxiety disorder  Tourette's disorder  Alcohol dependence with unspecified alcohol-induced disorder (HCC)  Has alcoholic cirrhosis with esophageal varices and a history of hepatic encephalopathy   30-minute with patient was spent on counseling and coordination of care. We discussed his diagnoses above and chronic psychosis with anxiety. Chronic visual hallucinations occ scary at night but no worse and limited AH at this time.  Overall he appears to be at baseline.  Counseling 20 min: Stop drinking entirely and permanently.  Disc resources to help this. Explained medial complications that he has from drinking.  Has alcoholic cirrhosis with esophageal varices and a history of hepatic encephalopathy.  Disc option of naltrexone.  He refuses.  I think I can do it.   Has been to AA not helpful.  Disc his life is at risk and did reality testing to ensure he understands.  Reports he does and is committed to stopping.  Doesn't want other help.  Regarding his chronic auditory hallucinations they are improved but he has still has chronic visual hallucinations of an unusual sort.  They are not improving with higher doses of  antipsychotic.  I am reluctant to go higher as I do not expect we are likely to see resolution.  We could consider clozapine but it is a much more difficult medicine to use requiring the weekly CBC due to the risk of aplastic anemia.  He is aware of that option but wishes to defer at this time.  Discussed potential metabolic side effects associated with atypical antipsychotics, as well as potential risk for movement side effects. Advised pt to contact office if movement side effects occur.  Discussed the risk of using multiple antipsychotics but he does appear to be tolerating them well at this time.  The Zyprexa to help with hallucinations  Continue olanzapine 20 mg HS  And alprazolam.  No abuse apparent.  No med changes indicated  We discussed the short-term risks associated with benzodiazepines including sedation and increased fall risk among others.  Discussed long-term side effect risk including dependence, potential withdrawal symptoms, and the potential eventual dose-related risk of dementia.  But recent studies from 2020 dispute this association between benzodiazepines and dementia risk. Newer studies in 2020 do not support an association with dementia.  Tics are tolerable off the risperidone and Orap. He feels it's manageable at present.  He may see another neuro bc can't afford Orap.  We discussed discussed his social anxiety and panic in public.  I am reluctant to add more medications at this particular time but we may consider this in the future.   Follow-up 6 months  Meredith Staggers MD, DFAPA    Please see After Visit Summary for patient specific instructions.  Future Appointments  Date Time Provider Department Center  11/11/2023  1:00 PM Cottle, Steva Ready., MD [facility] None      No orders of the defined types were placed in this encounter.     -------------------------------

## 2023-02-12 ENCOUNTER — Ambulatory Visit: Payer: PPO | Admitting: Gastroenterology

## 2023-02-12 ENCOUNTER — Other Ambulatory Visit: Payer: Self-pay | Admitting: Gastroenterology

## 2023-02-26 DIAGNOSIS — F1020 Alcohol dependence, uncomplicated: Secondary | ICD-10-CM | POA: Diagnosis not present

## 2023-02-26 DIAGNOSIS — D61818 Other pancytopenia: Secondary | ICD-10-CM | POA: Diagnosis not present

## 2023-02-26 DIAGNOSIS — Z1389 Encounter for screening for other disorder: Secondary | ICD-10-CM | POA: Diagnosis not present

## 2023-02-26 DIAGNOSIS — E1149 Type 2 diabetes mellitus with other diabetic neurological complication: Secondary | ICD-10-CM | POA: Diagnosis not present

## 2023-02-26 DIAGNOSIS — E039 Hypothyroidism, unspecified: Secondary | ICD-10-CM | POA: Diagnosis not present

## 2023-02-26 DIAGNOSIS — R7401 Elevation of levels of liver transaminase levels: Secondary | ICD-10-CM | POA: Diagnosis not present

## 2023-02-26 DIAGNOSIS — F319 Bipolar disorder, unspecified: Secondary | ICD-10-CM | POA: Diagnosis not present

## 2023-02-26 DIAGNOSIS — Z811 Family history of alcohol abuse and dependence: Secondary | ICD-10-CM | POA: Diagnosis not present

## 2023-02-26 DIAGNOSIS — K7682 Hepatic encephalopathy: Secondary | ICD-10-CM | POA: Diagnosis not present

## 2023-02-26 DIAGNOSIS — I8510 Secondary esophageal varices without bleeding: Secondary | ICD-10-CM | POA: Diagnosis not present

## 2023-02-26 DIAGNOSIS — G629 Polyneuropathy, unspecified: Secondary | ICD-10-CM | POA: Diagnosis not present

## 2023-02-26 DIAGNOSIS — K7031 Alcoholic cirrhosis of liver with ascites: Secondary | ICD-10-CM | POA: Diagnosis not present

## 2023-02-26 DIAGNOSIS — F17201 Nicotine dependence, unspecified, in remission: Secondary | ICD-10-CM | POA: Diagnosis not present

## 2023-02-26 DIAGNOSIS — E785 Hyperlipidemia, unspecified: Secondary | ICD-10-CM | POA: Diagnosis not present

## 2023-02-26 DIAGNOSIS — D649 Anemia, unspecified: Secondary | ICD-10-CM | POA: Diagnosis not present

## 2023-02-26 DIAGNOSIS — I10 Essential (primary) hypertension: Secondary | ICD-10-CM | POA: Diagnosis not present

## 2023-02-26 DIAGNOSIS — Z0000 Encounter for general adult medical examination without abnormal findings: Secondary | ICD-10-CM | POA: Diagnosis not present

## 2023-04-01 DIAGNOSIS — E119 Type 2 diabetes mellitus without complications: Secondary | ICD-10-CM | POA: Diagnosis not present

## 2023-04-01 DIAGNOSIS — F10229 Alcohol dependence with intoxication, unspecified: Secondary | ICD-10-CM | POA: Diagnosis not present

## 2023-04-01 DIAGNOSIS — K7682 Hepatic encephalopathy: Secondary | ICD-10-CM | POA: Diagnosis not present

## 2023-04-01 DIAGNOSIS — F1010 Alcohol abuse, uncomplicated: Secondary | ICD-10-CM | POA: Diagnosis not present

## 2023-04-01 DIAGNOSIS — Z960 Presence of urogenital implants: Secondary | ICD-10-CM | POA: Diagnosis not present

## 2023-04-01 DIAGNOSIS — J90 Pleural effusion, not elsewhere classified: Secondary | ICD-10-CM | POA: Diagnosis not present

## 2023-04-01 DIAGNOSIS — R911 Solitary pulmonary nodule: Secondary | ICD-10-CM | POA: Diagnosis not present

## 2023-04-01 DIAGNOSIS — I10 Essential (primary) hypertension: Secondary | ICD-10-CM | POA: Diagnosis not present

## 2023-04-01 DIAGNOSIS — F1020 Alcohol dependence, uncomplicated: Secondary | ICD-10-CM | POA: Diagnosis not present

## 2023-04-01 DIAGNOSIS — R4182 Altered mental status, unspecified: Secondary | ICD-10-CM | POA: Diagnosis not present

## 2023-04-01 DIAGNOSIS — E876 Hypokalemia: Secondary | ICD-10-CM | POA: Diagnosis not present

## 2023-04-01 DIAGNOSIS — N3000 Acute cystitis without hematuria: Secondary | ICD-10-CM | POA: Diagnosis not present

## 2023-04-01 DIAGNOSIS — R531 Weakness: Secondary | ICD-10-CM | POA: Diagnosis not present

## 2023-04-01 DIAGNOSIS — D649 Anemia, unspecified: Secondary | ICD-10-CM | POA: Diagnosis not present

## 2023-04-01 DIAGNOSIS — R000 Tachycardia, unspecified: Secondary | ICD-10-CM | POA: Diagnosis not present

## 2023-04-01 DIAGNOSIS — E872 Acidosis, unspecified: Secondary | ICD-10-CM | POA: Diagnosis not present

## 2023-04-01 DIAGNOSIS — K7031 Alcoholic cirrhosis of liver with ascites: Secondary | ICD-10-CM | POA: Diagnosis not present

## 2023-04-01 DIAGNOSIS — Z794 Long term (current) use of insulin: Secondary | ICD-10-CM | POA: Diagnosis not present

## 2023-04-01 DIAGNOSIS — R9431 Abnormal electrocardiogram [ECG] [EKG]: Secondary | ICD-10-CM | POA: Diagnosis not present

## 2023-04-01 DIAGNOSIS — R222 Localized swelling, mass and lump, trunk: Secondary | ICD-10-CM | POA: Diagnosis not present

## 2023-04-01 DIAGNOSIS — F209 Schizophrenia, unspecified: Secondary | ICD-10-CM | POA: Diagnosis not present

## 2023-04-01 DIAGNOSIS — Z8719 Personal history of other diseases of the digestive system: Secondary | ICD-10-CM | POA: Diagnosis not present

## 2023-04-01 DIAGNOSIS — R339 Retention of urine, unspecified: Secondary | ICD-10-CM | POA: Diagnosis not present

## 2023-04-01 DIAGNOSIS — K810 Acute cholecystitis: Secondary | ICD-10-CM | POA: Diagnosis not present

## 2023-04-01 DIAGNOSIS — L219 Seborrheic dermatitis, unspecified: Secondary | ICD-10-CM | POA: Diagnosis not present

## 2023-04-01 DIAGNOSIS — Z91128 Patient's intentional underdosing of medication regimen for other reason: Secondary | ICD-10-CM | POA: Diagnosis not present

## 2023-04-02 DIAGNOSIS — J90 Pleural effusion, not elsewhere classified: Secondary | ICD-10-CM | POA: Diagnosis not present

## 2023-04-02 DIAGNOSIS — R222 Localized swelling, mass and lump, trunk: Secondary | ICD-10-CM | POA: Diagnosis not present

## 2023-04-02 DIAGNOSIS — F1010 Alcohol abuse, uncomplicated: Secondary | ICD-10-CM | POA: Diagnosis not present

## 2023-04-02 DIAGNOSIS — K810 Acute cholecystitis: Secondary | ICD-10-CM | POA: Diagnosis not present

## 2023-04-02 DIAGNOSIS — R4182 Altered mental status, unspecified: Secondary | ICD-10-CM | POA: Diagnosis not present

## 2023-04-03 DIAGNOSIS — K810 Acute cholecystitis: Secondary | ICD-10-CM | POA: Diagnosis not present

## 2023-04-06 DIAGNOSIS — Z960 Presence of urogenital implants: Secondary | ICD-10-CM | POA: Diagnosis not present

## 2023-04-06 DIAGNOSIS — R339 Retention of urine, unspecified: Secondary | ICD-10-CM | POA: Diagnosis not present

## 2023-04-08 DIAGNOSIS — R4182 Altered mental status, unspecified: Secondary | ICD-10-CM | POA: Diagnosis not present

## 2023-04-12 DIAGNOSIS — R079 Chest pain, unspecified: Secondary | ICD-10-CM | POA: Diagnosis not present

## 2023-04-12 DIAGNOSIS — R339 Retention of urine, unspecified: Secondary | ICD-10-CM | POA: Diagnosis not present

## 2023-04-12 DIAGNOSIS — R600 Localized edema: Secondary | ICD-10-CM | POA: Diagnosis not present

## 2023-04-12 DIAGNOSIS — D649 Anemia, unspecified: Secondary | ICD-10-CM | POA: Diagnosis not present

## 2023-04-12 DIAGNOSIS — F3160 Bipolar disorder, current episode mixed, unspecified: Secondary | ICD-10-CM | POA: Diagnosis not present

## 2023-04-12 DIAGNOSIS — N400 Enlarged prostate without lower urinary tract symptoms: Secondary | ICD-10-CM | POA: Diagnosis not present

## 2023-04-12 DIAGNOSIS — K7460 Unspecified cirrhosis of liver: Secondary | ICD-10-CM | POA: Diagnosis not present

## 2023-04-15 DIAGNOSIS — N400 Enlarged prostate without lower urinary tract symptoms: Secondary | ICD-10-CM | POA: Diagnosis not present

## 2023-04-15 DIAGNOSIS — I10 Essential (primary) hypertension: Secondary | ICD-10-CM | POA: Diagnosis not present

## 2023-04-15 DIAGNOSIS — E785 Hyperlipidemia, unspecified: Secondary | ICD-10-CM | POA: Diagnosis not present

## 2023-04-15 DIAGNOSIS — K7460 Unspecified cirrhosis of liver: Secondary | ICD-10-CM | POA: Diagnosis not present

## 2023-04-22 DIAGNOSIS — I10 Essential (primary) hypertension: Secondary | ICD-10-CM | POA: Diagnosis not present

## 2023-04-22 DIAGNOSIS — R339 Retention of urine, unspecified: Secondary | ICD-10-CM | POA: Diagnosis not present

## 2023-04-22 DIAGNOSIS — E291 Testicular hypofunction: Secondary | ICD-10-CM | POA: Diagnosis not present

## 2023-04-22 DIAGNOSIS — F1020 Alcohol dependence, uncomplicated: Secondary | ICD-10-CM | POA: Diagnosis not present

## 2023-04-22 DIAGNOSIS — Z8639 Personal history of other endocrine, nutritional and metabolic disease: Secondary | ICD-10-CM | POA: Diagnosis not present

## 2023-04-22 DIAGNOSIS — K219 Gastro-esophageal reflux disease without esophagitis: Secondary | ICD-10-CM | POA: Diagnosis not present

## 2023-04-22 DIAGNOSIS — D649 Anemia, unspecified: Secondary | ICD-10-CM | POA: Diagnosis not present

## 2023-04-22 DIAGNOSIS — K766 Portal hypertension: Secondary | ICD-10-CM | POA: Diagnosis not present

## 2023-04-27 DIAGNOSIS — Z0000 Encounter for general adult medical examination without abnormal findings: Secondary | ICD-10-CM | POA: Diagnosis not present

## 2023-04-27 DIAGNOSIS — Z7185 Encounter for immunization safety counseling: Secondary | ICD-10-CM | POA: Diagnosis not present

## 2023-04-27 DIAGNOSIS — N400 Enlarged prostate without lower urinary tract symptoms: Secondary | ICD-10-CM | POA: Diagnosis not present

## 2023-04-27 DIAGNOSIS — K219 Gastro-esophageal reflux disease without esophagitis: Secondary | ICD-10-CM | POA: Diagnosis not present

## 2023-04-27 DIAGNOSIS — Z125 Encounter for screening for malignant neoplasm of prostate: Secondary | ICD-10-CM | POA: Diagnosis not present

## 2023-04-27 DIAGNOSIS — I10 Essential (primary) hypertension: Secondary | ICD-10-CM | POA: Diagnosis not present

## 2023-04-27 DIAGNOSIS — F3160 Bipolar disorder, current episode mixed, unspecified: Secondary | ICD-10-CM | POA: Diagnosis not present

## 2023-04-27 DIAGNOSIS — Z1211 Encounter for screening for malignant neoplasm of colon: Secondary | ICD-10-CM | POA: Diagnosis not present

## 2023-05-05 DIAGNOSIS — K7460 Unspecified cirrhosis of liver: Secondary | ICD-10-CM | POA: Diagnosis not present

## 2023-05-05 DIAGNOSIS — D649 Anemia, unspecified: Secondary | ICD-10-CM | POA: Diagnosis not present

## 2023-05-05 DIAGNOSIS — N400 Enlarged prostate without lower urinary tract symptoms: Secondary | ICD-10-CM | POA: Diagnosis not present

## 2023-07-04 ENCOUNTER — Other Ambulatory Visit: Payer: Self-pay | Admitting: Gastroenterology

## 2023-08-26 IMAGING — US US ABDOMEN LIMITED
1 series · 15 of 25 positions shown · non-contrast
Comparison: None.

CLINICAL DATA: Alcoholic cirrhosis.

EXAM:
ULTRASOUND ABDOMEN LIMITED RIGHT UPPER QUADRANT

[Series 1: us abdomen limited ruq mc & wl · 15 of 73 slices shown]
[im 1/73]
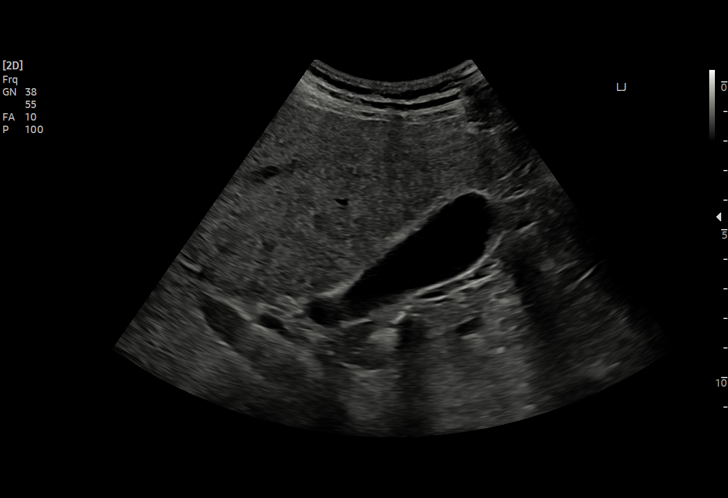
[im 7/73]
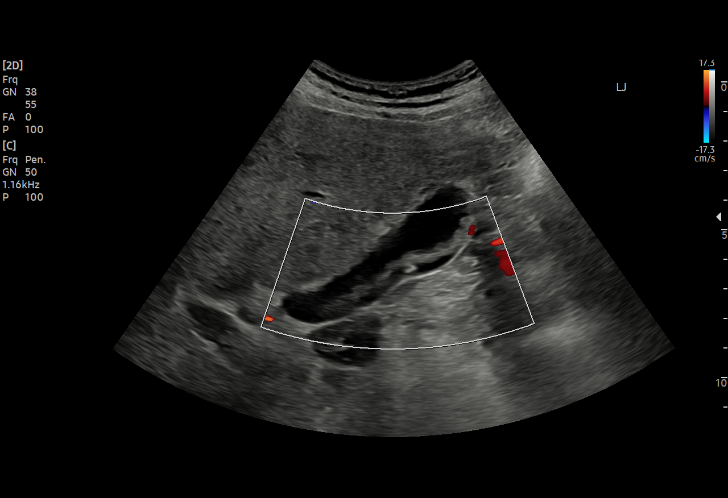
[im 13/73]
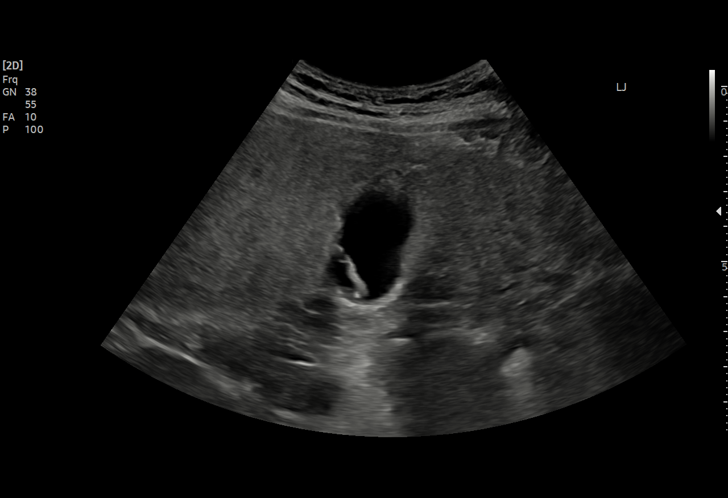
[im 16/73]
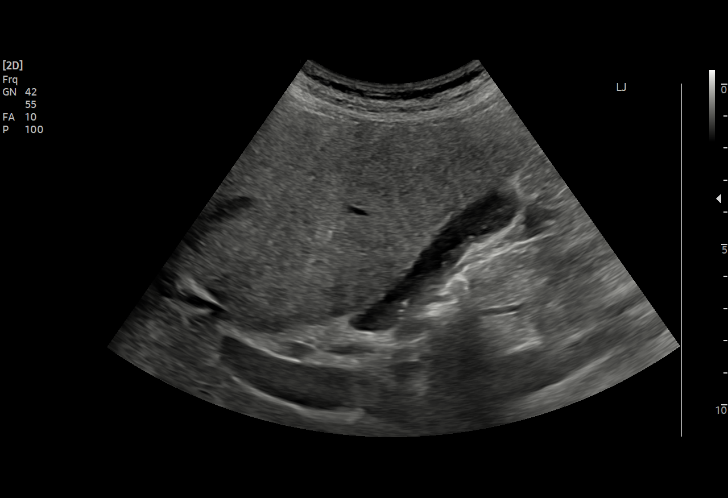
[im 22/73]
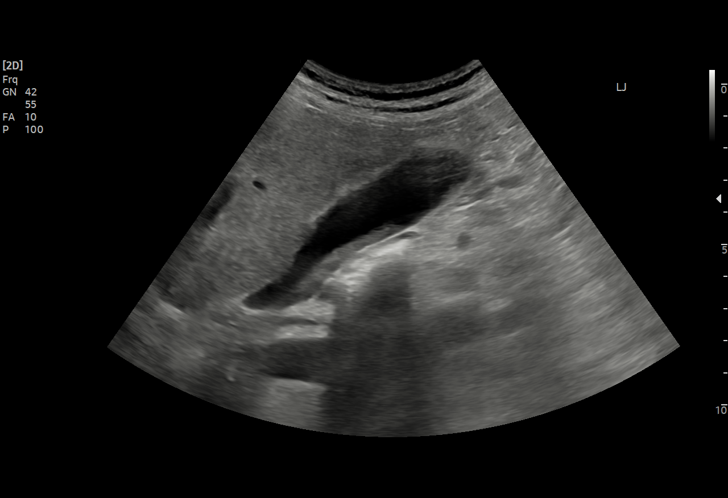
[im 28/73]
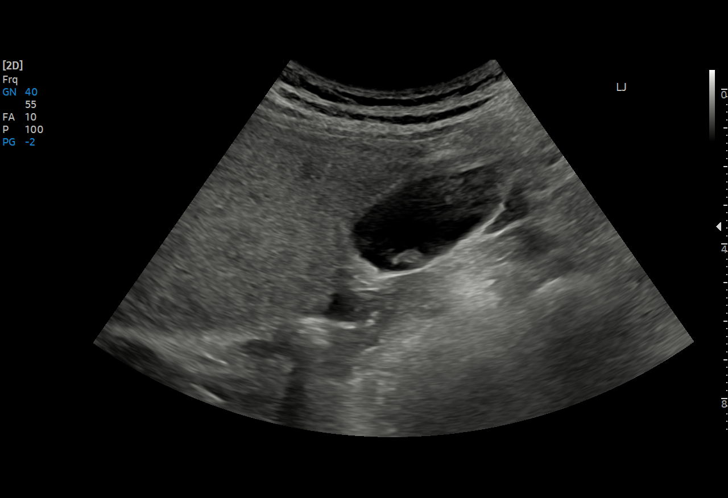
[im 31/73]
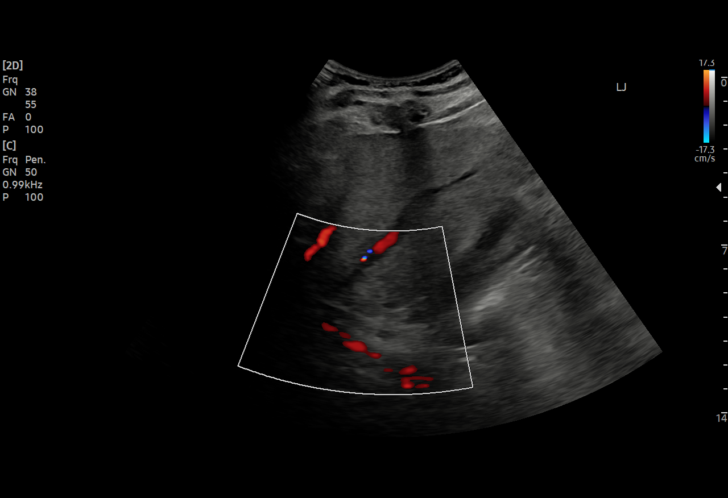
[im 37/73]
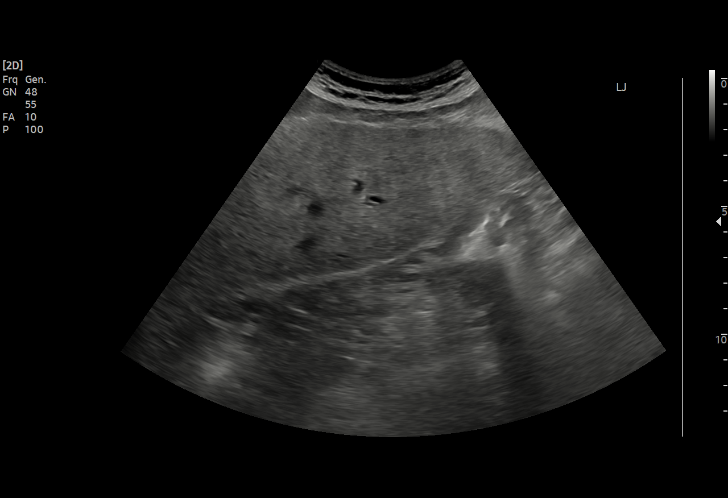
[im 43/73]
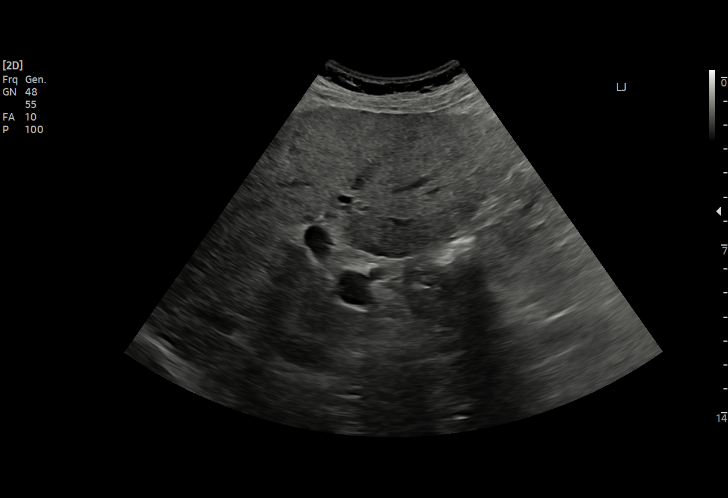
[im 46/73]
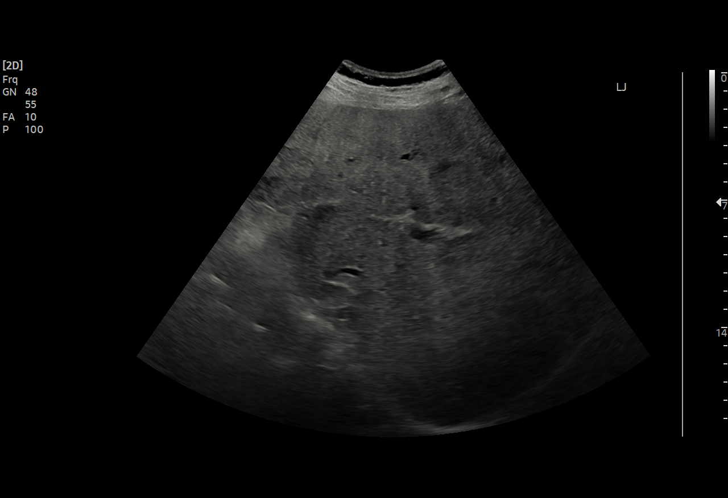
[im 52/73]
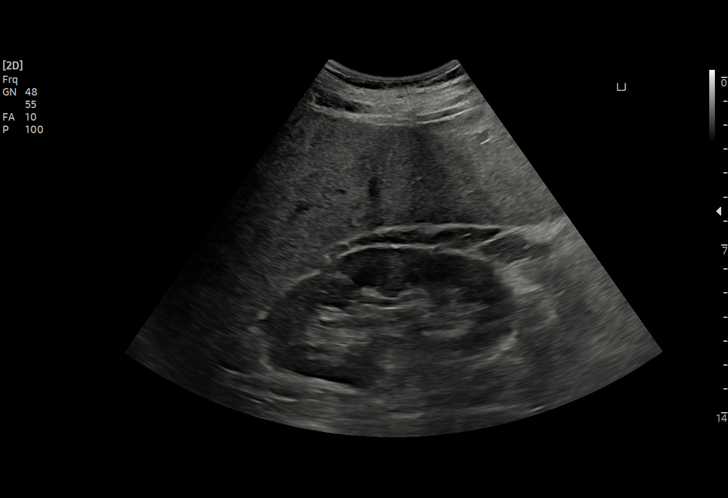
[im 58/73]
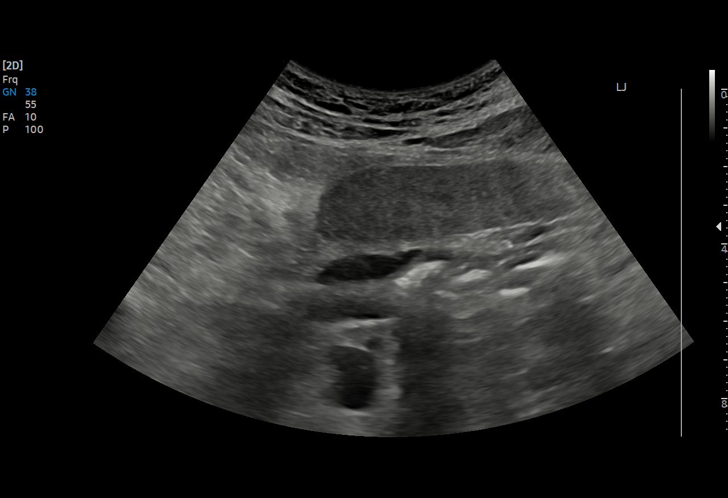
[im 61/73]
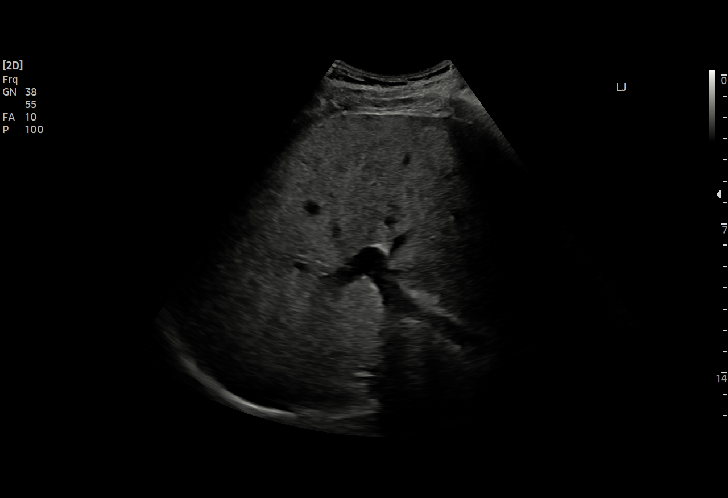
[im 67/73]
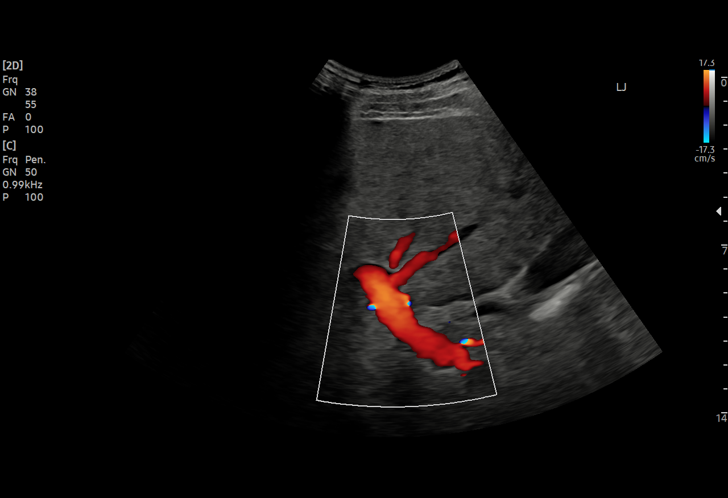
[im 73/73]
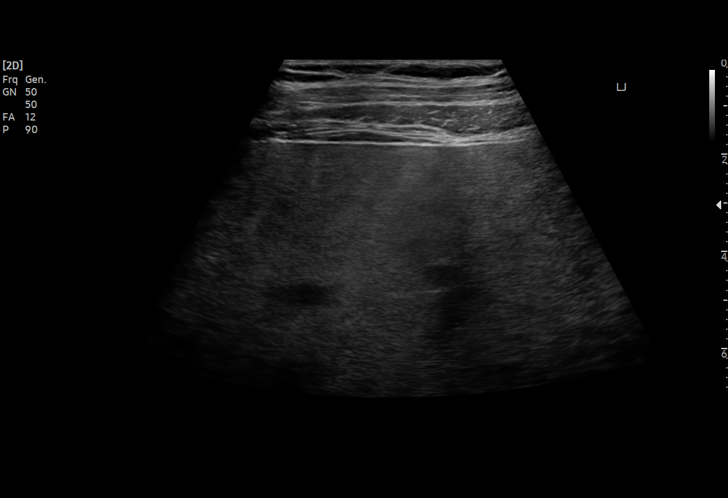

[15 of 25 positions shown; findings below may reference images not displayed]

FINDINGS: Gallbladder:

Heterogeneous, nonshadowing echogenic material is seen throughout
the gallbladder lumen. No abnormal flow is seen within this region
on color Doppler evaluation. No gallstones or wall thickening
visualized (3.5 mm). No sonographic Murphy sign noted by
sonographer.

Common bile duct:

Diameter: 4.1 mm

Liver:

No focal lesion identified. Diffusely increased echogenicity of the
liver parenchyma is noted. Portal vein is patent on color Doppler
imaging with normal direction of blood flow towards the liver.

Other: The study is limited secondary to patient breathing.
IMPRESSION: 1. Large amount of gallbladder sludge, without evidence of
cholelithiasis or acute cholecystitis.
2. Hepatic steatosis without focal liver lesions.

## 2023-11-11 ENCOUNTER — Ambulatory Visit: Payer: PPO | Admitting: Psychiatry
